# Patient Record
Sex: Female | Born: 1995 | Race: White | Hispanic: No | Marital: Single | Smoking: Never smoker
Health system: Southern US, Community
[De-identification: ages and names within clinical notes are randomized; demographics above are authoritative.]

## PROBLEM LIST (undated history)

## (undated) ENCOUNTER — Inpatient Hospital Stay (HOSPITAL_COMMUNITY): Payer: Self-pay

## (undated) DIAGNOSIS — F419 Anxiety disorder, unspecified: Secondary | ICD-10-CM

## (undated) DIAGNOSIS — K589 Irritable bowel syndrome without diarrhea: Secondary | ICD-10-CM

## (undated) DIAGNOSIS — J45909 Unspecified asthma, uncomplicated: Secondary | ICD-10-CM

## (undated) HISTORY — PX: WISDOM TOOTH EXTRACTION: SHX21

---
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
## 2016-01-31 ENCOUNTER — Ambulatory Visit
Admission: RE | Admit: 2016-01-31 | Discharge: 2016-01-31 | Disposition: A | Discharge status: Home / Self Care | Payer: 59 | Source: Ambulatory Visit | Attending: Family Medicine | Admitting: Family Medicine

## 2016-01-31 ENCOUNTER — Other Ambulatory Visit: Payer: Self-pay | Admitting: Family Medicine

## 2016-01-31 DIAGNOSIS — E041 Nontoxic single thyroid nodule: Secondary | ICD-10-CM

## 2016-02-01 ENCOUNTER — Other Ambulatory Visit: Payer: Self-pay | Admitting: Family Medicine

## 2016-02-01 DIAGNOSIS — E041 Nontoxic single thyroid nodule: Secondary | ICD-10-CM

## 2016-02-06 ENCOUNTER — Ambulatory Visit
Admission: RE | Admit: 2016-02-06 | Discharge: 2016-02-06 | Disposition: A | Discharge status: Home / Self Care | Payer: 59 | Source: Ambulatory Visit | Attending: Family Medicine | Admitting: Family Medicine

## 2016-02-06 ENCOUNTER — Other Ambulatory Visit (HOSPITAL_COMMUNITY)
Admission: RE | Admit: 2016-02-06 | Discharge: 2016-02-06 | Disposition: A | Discharge status: Home / Self Care | Payer: 59 | Source: Ambulatory Visit | Attending: General Surgery | Admitting: General Surgery

## 2016-02-06 DIAGNOSIS — E041 Nontoxic single thyroid nodule: Secondary | ICD-10-CM | POA: Insufficient documentation

## 2016-02-14 ENCOUNTER — Other Ambulatory Visit: Payer: Self-pay | Admitting: Otolaryngology

## 2016-02-19 NOTE — Pre-Procedure Instructions (Signed)
Kristin Graves  02/19/2016      [id number] - [place], [place] - [address], [address] [facility] [address], [address] [place] [id number] Phone: [phone] Fax: [phone]    Your procedure is scheduled on  Thursday, August 10th .  [facility] Admitting at 9:10 am             (posted surgery time is 11:10 am - 1:38 pm)   Call this number if you have problems the morning of surgery:  [phone]   Remember:  Do not eat food or drink liquids after midnight Wednesday.   Take these medicines the morning of surgery with A SIP OF WATER : Lexapro, Ativan   Do not wear jewelry, make-up or nail polish.  Do not wear lotions, powders, or perfumes.     Do not shave underarms & legs 48 hours prior to surgery.    Do not bring valuables to the hospital.  [facility] is not responsible for any belongings or valuables.  Contacts, dentures or bridgework may not be worn into surgery.  Leave your suitcase in the car.  After surgery it may be brought to your room. For patients admitted to the hospital, discharge time will be determined by your treatment team.    Name and phone number of your driver:     Please read over the following fact sheets that you were given. Pain Booklet and Surgical Site Infection Prevention

## 2016-02-20 ENCOUNTER — Ambulatory Visit (HOSPITAL_COMMUNITY)
Admission: RE | Admit: 2016-02-20 | Discharge: 2016-02-20 | Disposition: A | Discharge status: Home / Self Care | Payer: 59 | Source: Ambulatory Visit | Attending: Anesthesiology | Admitting: Anesthesiology

## 2016-02-20 ENCOUNTER — Encounter (HOSPITAL_COMMUNITY)
Admission: RE | Admit: 2016-02-20 | Discharge: 2016-02-20 | Disposition: A | Discharge status: Home / Self Care | Payer: 59 | Source: Ambulatory Visit | Attending: Otolaryngology | Admitting: Otolaryngology

## 2016-02-20 ENCOUNTER — Encounter (HOSPITAL_COMMUNITY): Payer: Self-pay

## 2016-02-20 DIAGNOSIS — Z01818 Encounter for other preprocedural examination: Secondary | ICD-10-CM | POA: Insufficient documentation

## 2016-02-20 HISTORY — DX: Unspecified asthma, uncomplicated: J45.909

## 2016-02-20 HISTORY — DX: Irritable bowel syndrome, unspecified: K58.9

## 2016-02-20 HISTORY — DX: Anxiety disorder, unspecified: F41.9

## 2016-02-20 LAB — CBC
HCT: 37.6 % (ref 36.0–46.0)
Hemoglobin: 12.3 g/dL (ref 12.0–15.0)
MCH: 29.6 pg (ref 26.0–34.0)
MCHC: 32.7 g/dL (ref 30.0–36.0)
MCV: 90.6 fL (ref 78.0–100.0)
PLATELETS: 293 10*3/uL (ref 150–400)
RBC: 4.15 MIL/uL (ref 3.87–5.11)
RDW: 12.5 % (ref 11.5–15.5)
WBC: 9.2 10*3/uL (ref 4.0–10.5)

## 2016-02-20 LAB — BASIC METABOLIC PANEL
ANION GAP: 7 (ref 5–15)
BUN: <5 mg/dL — ABNORMAL LOW (ref 6–20)
CO2: 25 mmol/L (ref 22–32)
Calcium: 9.6 mg/dL (ref 8.9–10.3)
Chloride: 105 mmol/L (ref 101–111)
Creatinine, Ser: 0.68 mg/dL (ref 0.44–1.00)
GFR calc Af Amer: >60 mL/min (ref >60)
Glucose, Bld: 75 mg/dL (ref 65–99)
POTASSIUM: 3.8 mmol/L (ref 3.5–5.1)
SODIUM: 137 mmol/L (ref 135–145)

## 2016-02-20 LAB — HCG, SERUM, QUALITATIVE: PREG SERUM: NEGATIVE

## 2016-02-20 NOTE — Progress Notes (Signed)
Pt. Followed by NP- Arlyce DiceKaplan at [facility]. Pt. Also followed by Dr. Loreta AveMann for IBS, has not started  Some new supplements that she was given at last appt. But will after this surgery.  Pt. Reports because of a vacation she was going on she rearrqnged her BCP so she intently did not have a period in July 2017. Pt. Denies cardiac concerns.

## 2016-02-22 ENCOUNTER — Encounter (HOSPITAL_COMMUNITY)
Admission: RE | Disposition: A | Discharge status: Home / Self Care | Payer: Self-pay | Source: Ambulatory Visit | Attending: Otolaryngology

## 2016-02-22 ENCOUNTER — Encounter (HOSPITAL_COMMUNITY): Payer: Self-pay | Admitting: Certified Registered Nurse Anesthetist

## 2016-02-22 ENCOUNTER — Ambulatory Visit (HOSPITAL_COMMUNITY): Payer: 59 | Admitting: Certified Registered"

## 2016-02-22 ENCOUNTER — Observation Stay (HOSPITAL_COMMUNITY)
Admission: RE | Admit: 2016-02-22 | Discharge: 2016-02-23 | Disposition: A | Discharge status: Home / Self Care | Payer: 59 | Source: Ambulatory Visit | Attending: Otolaryngology | Admitting: Otolaryngology

## 2016-02-22 DIAGNOSIS — E042 Nontoxic multinodular goiter: Principal | ICD-10-CM | POA: Insufficient documentation

## 2016-02-22 DIAGNOSIS — E079 Disorder of thyroid, unspecified: Secondary | ICD-10-CM | POA: Diagnosis present

## 2016-02-22 DIAGNOSIS — E0789 Other specified disorders of thyroid: Secondary | ICD-10-CM | POA: Diagnosis present

## 2016-02-22 HISTORY — PX: THYROIDECTOMY: SHX17

## 2016-02-22 SURGERY — THYROIDECTOMY
Anesthesia: General | Site: Neck | Laterality: Right

## 2016-02-22 MED ORDER — MIDAZOLAM HCL 2 MG/2ML IJ SOLN
INTRAMUSCULAR | Status: AC
  Filled 2016-02-22: qty 2

## 2016-02-22 MED ORDER — PROPOFOL 10 MG/ML IV BOLUS
INTRAVENOUS | Status: DC | PRN
  Administered 2016-02-22: 140 mg via INTRAVENOUS

## 2016-02-22 MED ORDER — 0.9 % SODIUM CHLORIDE (POUR BTL) OPTIME
TOPICAL | Status: DC | PRN
  Administered 2016-02-22: 1000 mL

## 2016-02-22 MED ORDER — LACTATED RINGERS IV SOLN
INTRAVENOUS | Status: DC
  Administered 2016-02-22 (×2): via INTRAVENOUS

## 2016-02-22 MED ORDER — LIDOCAINE 2% (20 MG/ML) 5 ML SYRINGE
INTRAMUSCULAR | Status: DC | PRN
  Administered 2016-02-22: 60 mg via INTRAVENOUS

## 2016-02-22 MED ORDER — HYDROCODONE-ACETAMINOPHEN 5-325 MG PO TABS
1.0000 | ORAL_TABLET | ORAL | Status: DC | PRN
  Administered 2016-02-22 – 2016-02-23 (×2): 1 via ORAL
  Filled 2016-02-22 (×2): qty 1

## 2016-02-22 MED ORDER — SUCCINYLCHOLINE CHLORIDE 200 MG/10ML IV SOSY
PREFILLED_SYRINGE | INTRAVENOUS | Status: DC | PRN
  Administered 2016-02-22: 100 mg via INTRAVENOUS

## 2016-02-22 MED ORDER — PROPOFOL 10 MG/ML IV BOLUS
INTRAVENOUS | Status: AC
  Filled 2016-02-22: qty 20

## 2016-02-22 MED ORDER — HYDROMORPHONE HCL 1 MG/ML IJ SOLN
0.2500 mg | INTRAMUSCULAR | Status: DC | PRN
  Administered 2016-02-22 (×4): 0.25 mg via INTRAVENOUS

## 2016-02-22 MED ORDER — ONDANSETRON 4 MG PO TBDP: 4.0000 mg | ORAL_TABLET | Freq: Four times a day (QID) | ORAL | Status: DC | PRN

## 2016-02-22 MED ORDER — HYDROMORPHONE HCL 1 MG/ML IJ SOLN
INTRAMUSCULAR | Status: AC
  Administered 2016-02-22: 0.25 mg via INTRAVENOUS
  Filled 2016-02-22: qty 1

## 2016-02-22 MED ORDER — FENTANYL CITRATE (PF) 100 MCG/2ML IJ SOLN
INTRAMUSCULAR | Status: DC | PRN
  Administered 2016-02-22: 50 ug via INTRAVENOUS
  Administered 2016-02-22: 150 ug via INTRAVENOUS

## 2016-02-22 MED ORDER — FENTANYL CITRATE (PF) 250 MCG/5ML IJ SOLN
INTRAMUSCULAR | Status: AC
  Filled 2016-02-22: qty 5

## 2016-02-22 MED ORDER — MIDAZOLAM HCL 5 MG/5ML IJ SOLN
INTRAMUSCULAR | Status: DC | PRN
  Administered 2016-02-22: 2 mg via INTRAVENOUS

## 2016-02-22 MED ORDER — ONDANSETRON HCL 4 MG/2ML IJ SOLN
INTRAMUSCULAR | Status: DC | PRN
  Administered 2016-02-22: 4 mg via INTRAVENOUS

## 2016-02-22 MED ORDER — DEXTROSE-NACL 5-0.45 % IV SOLN
INTRAVENOUS | Status: DC
  Administered 2016-02-22 – 2016-02-23 (×2): via INTRAVENOUS

## 2016-02-22 MED ORDER — ONDANSETRON HCL 4 MG/2ML IJ SOLN: 4.0000 mg | Freq: Four times a day (QID) | INTRAMUSCULAR | Status: DC | PRN

## 2016-02-22 SURGICAL SUPPLY — 59 items
APPLIER CLIP 9.375 SM OPEN (CLIP) ×3
APR CLP SM 9.3 20 MLT OPN (CLIP) ×1
ATTRACTOMAT 16X20 MAGNETIC DRP (DRAPES) IMPLANT
BLADE SURG 15 STRL LF DISP TIS (BLADE) IMPLANT
BLADE SURG 15 STRL SS (BLADE)
BLADE SURG ROTATE 9660 (MISCELLANEOUS) IMPLANT
CANISTER SUCTION 2500CC (MISCELLANEOUS) ×3 IMPLANT
CLEANER TIP ELECTROSURG 2X2 (MISCELLANEOUS) ×3 IMPLANT
CLIP APPLIE 9.375 SM OPEN (CLIP) ×1 IMPLANT
CONT SPEC 4OZ CLIKSEAL STRL BL (MISCELLANEOUS) ×6 IMPLANT
CORDS BIPOLAR (ELECTRODE) ×3 IMPLANT
COVER SURGICAL LIGHT HANDLE (MISCELLANEOUS) ×3 IMPLANT
CRADLE DONUT ADULT HEAD (MISCELLANEOUS) IMPLANT
DRAIN JACKSON RD 7FR 3/32 (WOUND CARE) ×5 IMPLANT
DRAPE POUCH INSTRU U-SHP 10X18 (DRAPES) ×2 IMPLANT
DRAPE PROXIMA HALF (DRAPES) ×3 IMPLANT
ELECT COATED BLADE 2.86 ST (ELECTRODE) ×3 IMPLANT
ELECT PAIRED SUBDERMAL (MISCELLANEOUS) ×3
ELECT REM PT RETURN 9FT ADLT (ELECTROSURGICAL) ×3
ELECTRODE PAIRED SUBDERMAL (MISCELLANEOUS) IMPLANT
ELECTRODE REM PT RTRN 9FT ADLT (ELECTROSURGICAL) ×1 IMPLANT
EVACUATOR SILICONE 100CC (DRAIN) ×3 IMPLANT
GAUZE SPONGE 2X2 8PLY STRL LF (GAUZE/BANDAGES/DRESSINGS) IMPLANT
GAUZE SPONGE 4X4 16PLY XRAY LF (GAUZE/BANDAGES/DRESSINGS) ×5 IMPLANT
GLOVE BIO SURGEON STRL SZ 6.5 (GLOVE) ×2 IMPLANT
GLOVE BIO SURGEONS STRL SZ 6.5 (GLOVE) ×1
GLOVE BIOGEL PI IND STRL 6.5 (GLOVE) IMPLANT
GLOVE BIOGEL PI INDICATOR 6.5 (GLOVE) ×2
GLOVE ECLIPSE 6.5 STRL STRAW (GLOVE) ×4 IMPLANT
GLOVE ECLIPSE 7.5 STRL STRAW (GLOVE) ×3 IMPLANT
GLOVE ECLIPSE 8.0 STRL XLNG CF (GLOVE) ×3 IMPLANT
GOWN BRE IMP SLV AUR XL STRL (GOWN DISPOSABLE) ×3 IMPLANT
GOWN STRL REUS W/ TWL LRG LVL3 (GOWN DISPOSABLE) ×2 IMPLANT
GOWN STRL REUS W/TWL LRG LVL3 (GOWN DISPOSABLE) ×9
KIT BASIN OR (CUSTOM PROCEDURE TRAY) ×3 IMPLANT
KIT ROOM TURNOVER OR (KITS) ×3 IMPLANT
LIQUID BAND (GAUZE/BANDAGES/DRESSINGS) ×3 IMPLANT
LOCATOR NERVE 3 VOLT (DISPOSABLE) IMPLANT
NDL HYPO 25GX1X1/2 BEV (NEEDLE) IMPLANT
NEEDLE HYPO 25GX1X1/2 BEV (NEEDLE) IMPLANT
NS IRRIG 1000ML POUR BTL (IV SOLUTION) ×3 IMPLANT
PAD ARMBOARD 7.5X6 YLW CONV (MISCELLANEOUS) ×6 IMPLANT
PENCIL FOOT CONTROL (ELECTRODE) ×3 IMPLANT
PROBE NERVBE PRASS .33 (MISCELLANEOUS) ×3 IMPLANT
SHEARS HARMONIC 9CM CVD (BLADE) ×3 IMPLANT
SPONGE GAUZE 2X2 STER 10/PKG (GAUZE/BANDAGES/DRESSINGS) ×2
SPONGE INTESTINAL PEANUT (DISPOSABLE) IMPLANT
STAPLER VISISTAT 35W (STAPLE) ×5 IMPLANT
SUT CHROMIC 4 0 PS 2 18 (SUTURE) ×6 IMPLANT
SUT ETHILON 3 0 PS 1 (SUTURE) ×3 IMPLANT
SUT SILK 2 0 (SUTURE) ×3
SUT SILK 2-0 18XBRD TIE 12 (SUTURE) ×1 IMPLANT
SUT SILK 4 0 (SUTURE)
SUT SILK 4-0 18XBRD TIE 12 (SUTURE) IMPLANT
TAPE CLOTH SURG 4X10 WHT LF (GAUZE/BANDAGES/DRESSINGS) ×2 IMPLANT
TOWEL OR 17X24 6PK STRL BLUE (TOWEL DISPOSABLE) ×3 IMPLANT
TRAY ENT MC OR (CUSTOM PROCEDURE TRAY) ×3 IMPLANT
TRAY FOLEY CATH 16FR SILVER (SET/KITS/TRAYS/PACK) IMPLANT
TUBE ENDOTRAC EMG 7X10.2 (MISCELLANEOUS) ×2 IMPLANT

## 2016-02-22 NOTE — H&P (Signed)
Kristin Graves is an 20 y.o. female.   Chief Complaint: right thyroid massHPI: hx of mass in the right thyroid. She had attempt at FNA but nondiagnositic. She does not want to repeat and wants it out.   Past Medical History:  Diagnosis Date  . Anxiety   . Asthma    yrs. since she had to use rescue inhaler , it was exercise induced   . IBS (irritable bowel syndrome)     Past Surgical History:  Procedure Laterality Date  . 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 WISDOM TOOTH EXTRACTION      No family history on file. Social History:  reports that she has never smoked. She has never used smokeless tobacco. She reports that she does not drink alcohol or use drugs.  Allergies:  Allergies  Allergen Reactions  . No Known Allergies Other (See Comments)    No prescriptions prior to admission.    Results for orders placed or performed during the hospital encounter of 02/20/16 (from the past 48 hour(s))  hCG, serum, qualitative     Status: None   Collection Time: 02/20/16  2:12 PM  Result Value Ref Range   Preg, Serum NEGATIVE NEGATIVE    Comment:        THE SENSITIVITY OF THIS METHODOLOGY IS >10 mIU/mL.   CBC     Status: None   Collection Time: 02/20/16  2:12 PM  Result Value Ref Range   WBC 9.2 4.0 - 10.5 K/uL   RBC 4.15 3.87 - 5.11 MIL/uL   Hemoglobin 12.3 12.0 - 15.0 g/dL   HCT 37.6 36.0 - 46.0 %   MCV 90.6 78.0 - 100.0 fL   MCH 29.6 26.0 - 34.0 pg   MCHC 32.7 30.0 - 36.0 g/dL   RDW 12.5 11.5 - 15.5 %   Platelets 293 150 - 400 K/uL  Basic metabolic panel     Status: Abnormal   Collection Time: 02/20/16  2:12 PM  Result Value Ref Range   Sodium 137 135 - 145 mmol/L   Potassium 3.8 3.5 - 5.1 mmol/L   Chloride 105 101 - 111 mmol/L   CO2 25 22 - 32 mmol/L   Glucose, Bld 75 65 - 99 mg/dL   BUN <5 (L) 6 - 20 mg/dL   Creatinine, Ser 0.68 0.44 - 1.00 mg/dL   Calcium 9.6 8.9 - 10.3 mg/dL   GFR calc non Af Amer >60 >60 mL/min   GFR calc Af Amer >60 >60  mL/min    Comment: (NOTE) The eGFR has been calculated using the CKD EPI equation. This calculation has not been validated in all clinical situations. eGFR's persistently <60 mL/min signify possible Chronic Kidney Disease.    Anion gap 7 5 - 15   Dg Chest 2 View  Result Date: 02/20/2016 CLINICAL DATA:  Preoperative respiratory evaluation for thyroidectomy. EXAM: CHEST  2 VIEW COMPARISON:  None. FINDINGS: The lungs are clear wiithout focal pneumonia, edema, pneumothorax or pleural effusion. The cardiopericardial silhouette is within normal limits for size. Radiopaque foreign body projecting over the posterior lateral seventh ribs bilaterally presumed to be external to the patient. The visualized bony structures of the thorax are intact. IMPRESSION: No active cardiopulmonary disease. Electronically Signed   By: Misty Stanley M.D.   On: 02/20/2016 15:37    Review of Systems  Constitutional: Negative.   HENT: Negative.   Eyes: Negative.   Respiratory: Negative.   Cardiovascular: Negative.  Skin: Negative.     Last menstrual period 12/23/2015. Physical Exam  Constitutional: She appears well-developed and well-nourished.  HENT:  Head: Normocephalic.  Nose: Nose normal.  Mouth/Throat: Oropharynx is clear and moist.  Eyes: Conjunctivae are normal. Pupils are equal, round, and reactive to light.  Neck: Normal range of motion. Thyromegaly present.  Cardiovascular: Normal rate.   Respiratory: Effort normal.  GI: Soft.     Assessment/Plan Thyroid mass- discussed thyroidectomy and possible total. She is ready to proceed  Melissa Montane, MD 02/22/2016, 7:17 AM

## 2016-02-22 NOTE — Op Note (Signed)
Preop/postop diagnosis: Right thyroid mass Procedure: Right thyroidectomy with Nims monitor Anesthesia: Gen. Estimated blood loss: Proximally 20 mL Indications: 20 year old with a mass in the right neck that has come up for many months. She had a fine-needle aspiration which did not show a definitive diagnosis. She did not want to repeat that. She wants to have the mass removed. She was informed a risk and benefits of the procedure and options were discussed all questions are answered and consent was obtained. Procedure: Patient was taken to the operating room placed in the supine position after general Nims monitor anesthesia was placed in the supine position prepped and draped in the usual sterile manner. The Nims monitor was connected and there seem to be good function. An incision was outlined in the lower neck in a skin crease opened with a 15 blade. Dissection was carried down to the platysma muscle and it was divided laterally but a small incision was made to only the very medial aspect of the platysma was visualized. A subplatysmal flap was elevated superiorly and inferiorly. The fascia was then divided over the strap muscles in the diastases of the strap muscle was identified. Dissection was carried off to the right where the thyroid gland was identified. Layer by layer dissection over the capsule of the thyroid was performed. Both the upper and lower parathyroid glands were identified and dissected off the thyroid gland. The artery and veins were divided with the harmonic scalpel. The nerve was identified and then confirmed with the Nims monitor stimulator. Dissection was carried up to the nerve entrance and again the stimulator was performed to check its position which was identified. Dissection was then carried out medial to this on Berry's ligament and the thyroid gland was removed to the isthmus and the isthmus was divided. The gland was sent for frozen section which came back benign. The wound  was irrigated with saline and then a #7 JP drain was placed into the wound and brought out the wound laterally. The wound was closed with interrupted 4-0 chromic and Dermabond to close the skin. She was then awakened brought to recovery room in stable condition counts correct

## 2016-02-22 NOTE — Anesthesia Procedure Notes (Signed)
Procedure Name: Intubation Date/Time: 02/22/2016 11:43 AM Performed by: Bobbie StackANDERSON, Lakela Kuba KIRSTEN Pre-anesthesia Checklist: Patient identified, Emergency Drugs available, Suction available and Patient being monitored Patient Re-evaluated:Patient Re-evaluated prior to inductionOxygen Delivery Method: Circle system utilized Preoxygenation: Pre-oxygenation with 100% oxygen Intubation Type: IV induction Ventilation: Mask ventilation without difficulty Laryngoscope Size: Glidescope and 3 Grade View: Grade I Tube type: Oral (NIMS tube) Tube size: 7.0 mm Number of attempts: 1 Airway Equipment and Method: Video-laryngoscopy and Rigid stylet Placement Confirmation: ETT inserted through vocal cords under direct vision,  positive ETCO2 and breath sounds checked- equal and bilateral Tube secured with: Tape Dental Injury: Teeth and Oropharynx as per pre-operative assessment  Comments: Glidescope used to ensure proper positioning for NIMS tube.  Surgeon at bedside.  Agreed with placement and correct positioning of ETT.

## 2016-02-22 NOTE — Anesthesia Postprocedure Evaluation (Signed)
Anesthesia Post Note  Patient: Kristin Graves  Procedure(s) Performed: Procedure(s) (LRB): Right THYROIDECTOMY (Right)  Patient location during evaluation: PACU Anesthesia Type: General Level of consciousness: awake and alert Pain management: pain level controlled Vital Signs Assessment: post-procedure vital signs reviewed and stable Respiratory status: spontaneous breathing, nonlabored ventilation, respiratory function stable and patient connected to nasal cannula oxygen Cardiovascular status: blood pressure returned to baseline and stable Postop Assessment: no signs of nausea or vomiting Anesthetic complications: no    Last Vitals:  Vitals:   02/22/16 1356 02/22/16 1400  BP: 130/82   Pulse: 83 83  Resp: 14 20  Temp:      Last Pain:  Vitals:   02/22/16 1345  TempSrc:   PainSc: Asleep                 Navjot Loera,W. EDMOND

## 2016-02-22 NOTE — Transfer of Care (Signed)
Immediate Anesthesia Transfer of Care Note  Patient: Kristin Graves  Procedure(s) Performed: Procedure(s): Right THYROIDECTOMY (Right)  Patient Location: PACU  Anesthesia Type:General  Level of Consciousness: patient cooperative and responds to stimulation, drowsy  Airway & Oxygen Therapy: Patient Spontanous Breathing and Patient connected to nasal cannula oxygen  Post-op Assessment: Report given to RN and Post -op Vital signs reviewed and stable  Post vital signs: Reviewed and stable  Last Vitals:  Vitals:   02/22/16 0950 02/22/16 1312  BP: 117/65 121/60  Pulse: 78 (!) 113  Resp: 18 12  Temp: 37.2 C     Last Pain:  Vitals:   02/22/16 0950  TempSrc: Oral      Patients Stated Pain Goal: 2 (02/22/16 0931)  Complications: No apparent anesthesia complications

## 2016-02-22 NOTE — Anesthesia Preprocedure Evaluation (Addendum)
Anesthesia Evaluation  Patient identified by MRN, date of birth, ID band Patient awake    Reviewed: Allergy & Precautions, H&P , NPO status , Patient's Chart, lab work & pertinent test results  Airway Mallampati: II  TM Distance: >3 FB Neck ROM: Full    Dental no notable dental hx. (+) Teeth Intact, Dental Advisory Given   Pulmonary asthma ,    Pulmonary exam normal breath sounds clear to auscultation       Cardiovascular negative cardio ROS   Rhythm:Regular Rate:Normal     Neuro/Psych Anxiety negative neurological ROS     GI/Hepatic negative GI ROS, Neg liver ROS,   Endo/Other  negative endocrine ROS  Renal/GU negative Renal ROS  negative genitourinary   Musculoskeletal   Abdominal   Peds  Hematology negative hematology ROS (+)   Anesthesia Other Findings   Reproductive/Obstetrics negative OB ROS                            Anesthesia Physical Anesthesia Plan  ASA: II  Anesthesia Plan: General   Post-op Pain Management:    Induction: Intravenous  Airway Management Planned: Oral ETT  Additional Equipment:   Intra-op Plan:   Post-operative Plan: Extubation in OR  Informed Consent: I have reviewed the patients History and Physical, chart, labs and discussed the procedure including the risks, benefits and alternatives for the proposed anesthesia with the patient or authorized representative who has indicated his/her understanding and acceptance.   Dental advisory given  Plan Discussed with: CRNA  Anesthesia Plan Comments:         Anesthesia Quick Evaluation

## 2016-02-22 NOTE — Progress Notes (Signed)
Patient ID: Kristin Graves, female   DOB: Nov 27, 1995, 20 y.o.   MRN: [id number] Post op check  No complaints, drinking well so far. Voice normal. Incision clean, dry, intact. JP charged and functioning.  Stable post op. Overnight observation.

## 2016-02-23 ENCOUNTER — Encounter (HOSPITAL_COMMUNITY): Payer: Self-pay | Admitting: Otolaryngology

## 2016-02-23 DIAGNOSIS — E042 Nontoxic multinodular goiter: Secondary | ICD-10-CM | POA: Diagnosis not present

## 2016-02-23 MED ORDER — HYDROCODONE-ACETAMINOPHEN 5-325 MG PO TABS: 1.0000 | ORAL_TABLET | ORAL | 0 refills | Status: DC | PRN

## 2016-02-23 NOTE — Discharge Summary (Signed)
Physician Discharge Summary  Patient ID: Kristin Graves MRN: [id number] DOB/AGE: [birth year] 20 y.o.  Admit date: 02/22/2016 Discharge date: 02/23/2016  Admission Diagnoses:thyroid mass  Discharge Diagnoses: same Active Problems:   Thyroid mass   Discharged Condition: good  Hospital Course: admitted after thyroid and did well. Ambulating and taking fluids. Drain removed and wound excellent. No cramping. Voice normal. Discharge rto home and follow up in 1 week.   Consults: None  Significant Diagnostic Studies: 0  Treatments: thyroidectomy right  Discharge Exam: Blood pressure (!) 98/50, pulse 62, temperature 98.3 F (36.8 C), temperature source Oral, resp. rate 15, height 5\' 11"  (1.803 m), weight 65.7 kg (144 lb 13.5 oz), last menstrual period 12/23/2015, SpO2 98 %. alert awake. voice normal. wound excellent. no swelling or eryhtema. cv rrr l- clear ext no swelling or pain.  Disposition: Final discharge disposition not confirmed      Signed: Suzanna ObeyBYERS, Dareld Mcauliffe 02/23/2016, 7:17 AM

## 2016-02-23 NOTE — Progress Notes (Signed)
Dc instructions given to pt at this time.  Pt verbalized understanding of all instructions.  No s/s of any acute distress noted.  No c/o pain.

## 2017-01-23 ENCOUNTER — Other Ambulatory Visit: Payer: Self-pay | Admitting: Gastroenterology

## 2017-01-23 DIAGNOSIS — R1033 Periumbilical pain: Secondary | ICD-10-CM

## 2017-01-23 NOTE — Progress Notes (Signed)
Miasia Crabtree MD 

## 2017-01-28 ENCOUNTER — Ambulatory Visit
Admission: RE | Admit: 2017-01-28 | Discharge: 2017-01-28 | Disposition: A | Discharge status: Home / Self Care | Payer: 59 | Source: Ambulatory Visit | Attending: Gastroenterology | Admitting: Gastroenterology

## 2017-01-28 DIAGNOSIS — R1033 Periumbilical pain: Secondary | ICD-10-CM

## 2017-01-28 MED ORDER — IOPAMIDOL (ISOVUE-300) INJECTION 61%
100.0000 mL | Freq: Once | INTRAVENOUS | Status: AC | PRN
  Administered 2017-01-28: 100 mL via INTRAVENOUS

## 2018-02-23 LAB — OB RESULTS CONSOLE GC/CHLAMYDIA
CHLAMYDIA, DNA PROBE: NEGATIVE
Gonorrhea: NEGATIVE

## 2018-02-23 LAB — OB RESULTS CONSOLE RPR: RPR: NONREACTIVE

## 2018-02-23 LAB — OB RESULTS CONSOLE HIV ANTIBODY (ROUTINE TESTING): HIV: NONREACTIVE

## 2018-02-25 IMAGING — CT CT ABD-PELV W/ CM
2 of 4 series · 15 of 46 positions shown, 17 images · IV contrast (APPLIED)
Comparison: None.

CLINICAL DATA: 21-year-old female with chronic
constipation/diarrhea and generalized abdominal pain.

EXAM:
CT ABDOMEN AND PELVIS WITH CONTRAST
TECHNIQUE: Multidetector CT imaging of the abdomen and pelvis was performed
using the standard protocol following bolus administration of
intravenous contrast.
CONTRAST:  100mL [id number] IOPAMIDOL ([id number]) INJECTION 61%

[Series 2: abd/pelvis w/cm · axial · 0.63mm/px · z∈[-156,+274]mm · 12 of 98 slices shown, 14 images]
[im 8/98  soft-tissue]
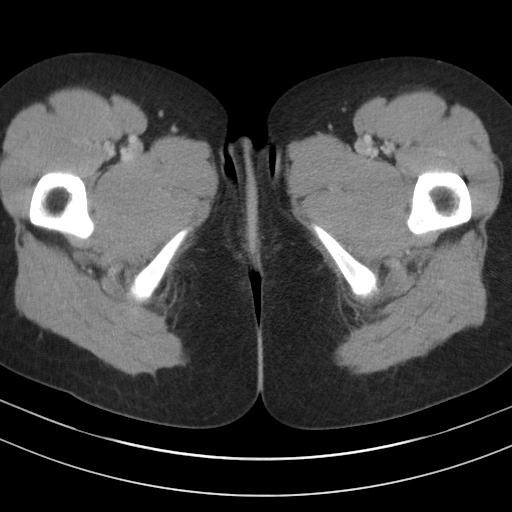
[im 8/98  bone]
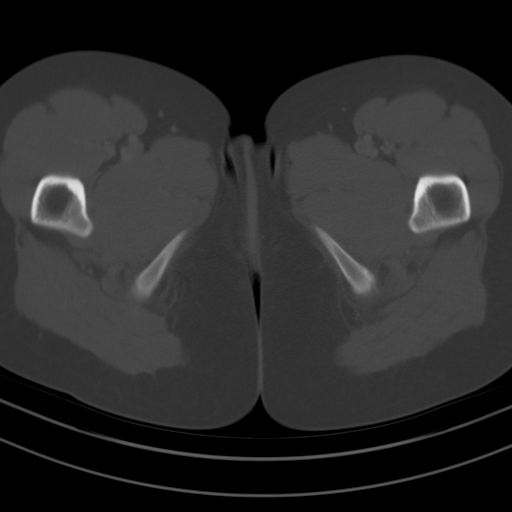
[im 16/98  soft-tissue]
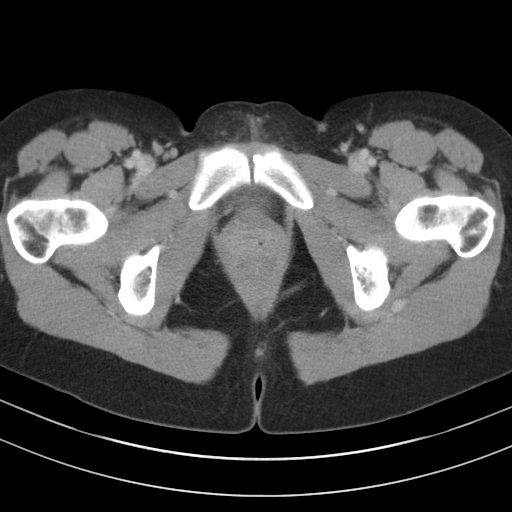
[im 24/98  soft-tissue]
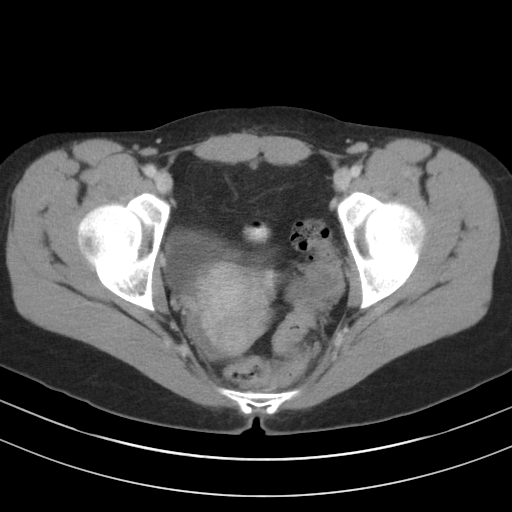
[im 32/98  soft-tissue]
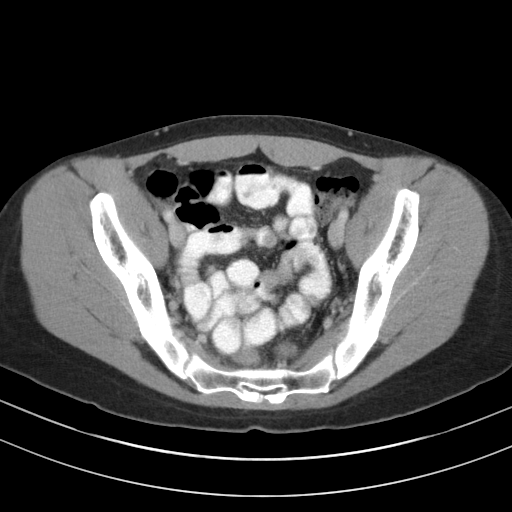
[im 39/98  soft-tissue]
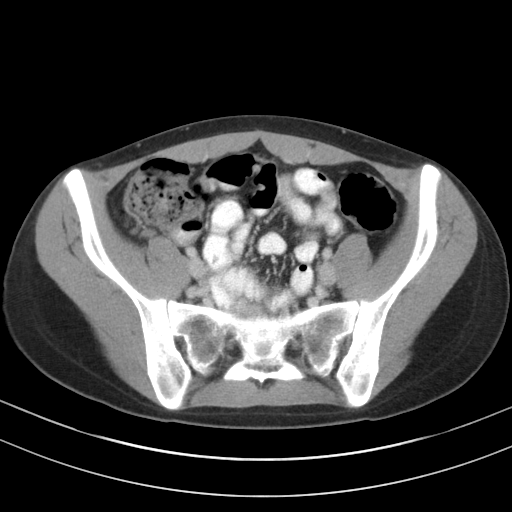
[im 47/98  soft-tissue]
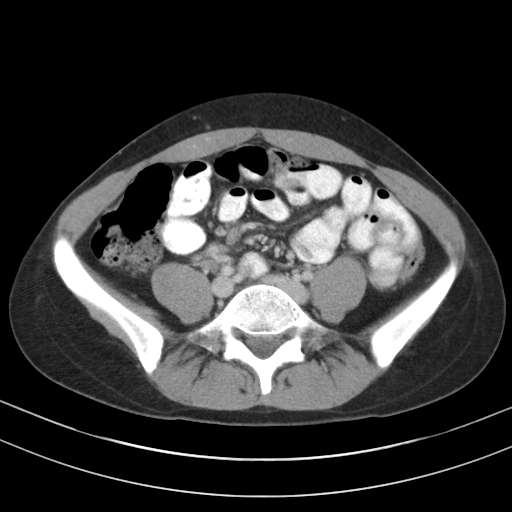
[im 55/98  soft-tissue]
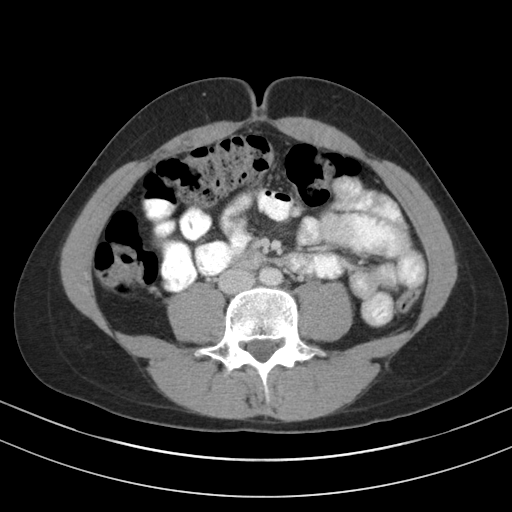
[im 63/98  soft-tissue]
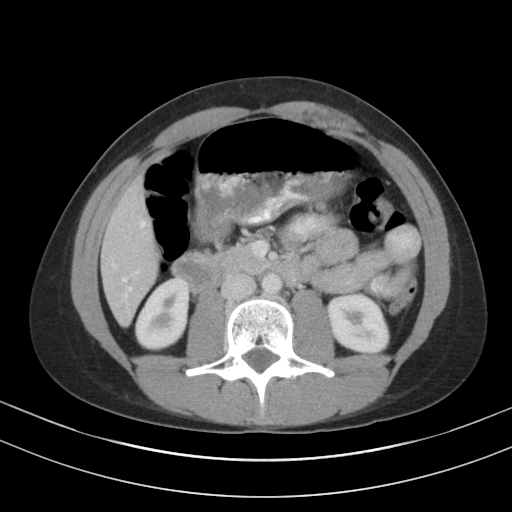
[im 70/98  soft-tissue]
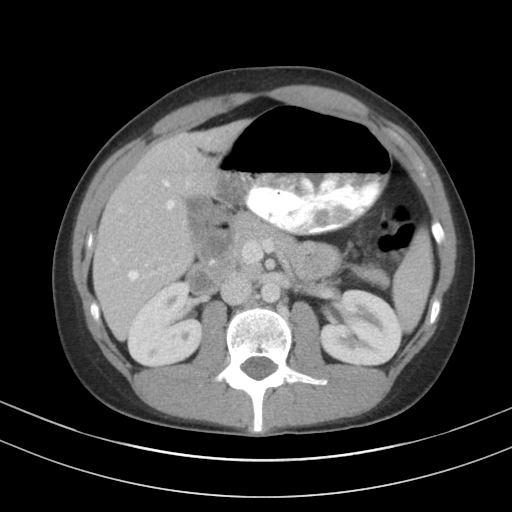
[im 70/98  bone]
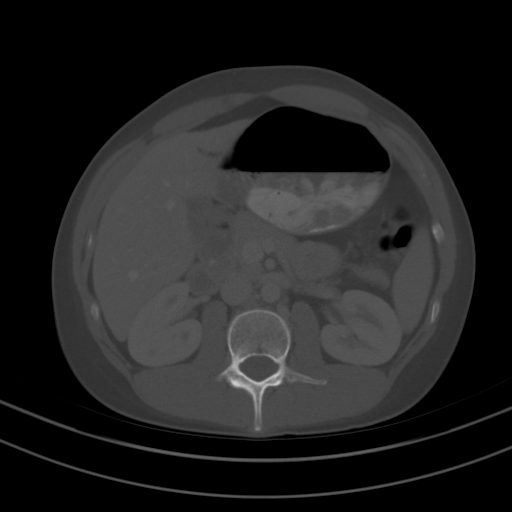
[im 78/98  soft-tissue]
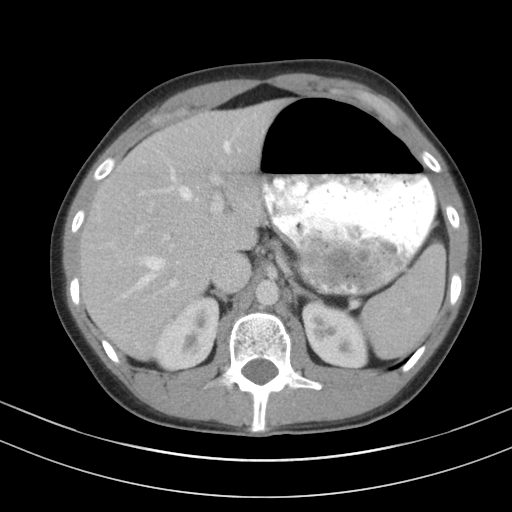
[im 86/98  soft-tissue]
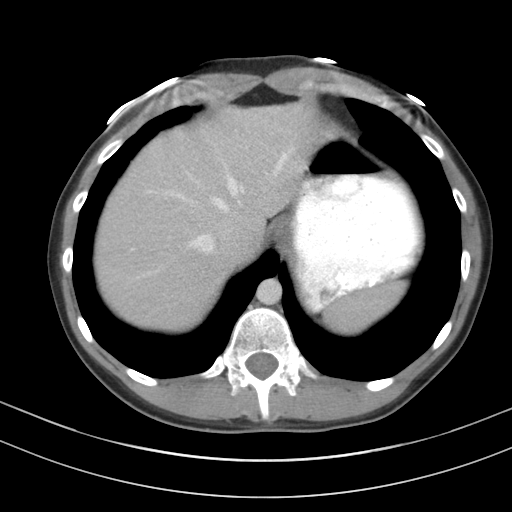
[im 94/98  soft-tissue]
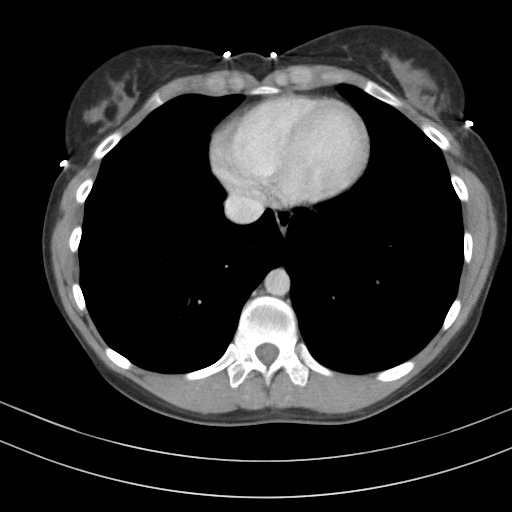

[Series 3: cor · coronal · 0.64mm/px · 3 of 86 slices shown]
[im 29/86  soft-tissue]
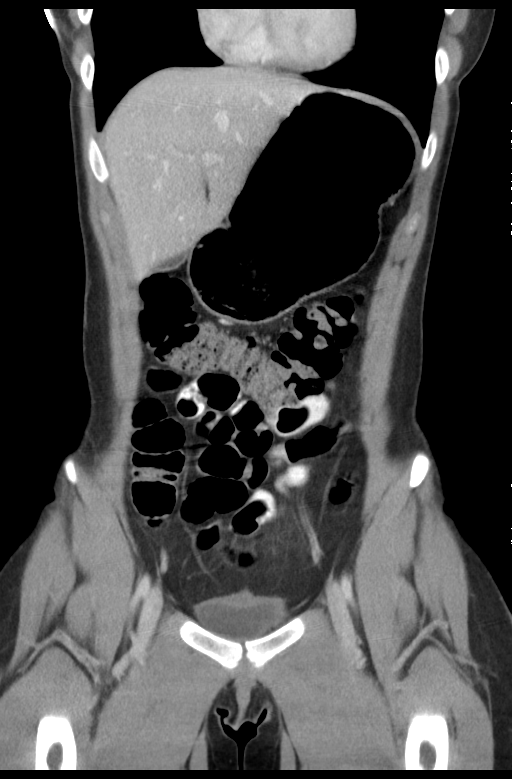
[im 38/86  soft-tissue]
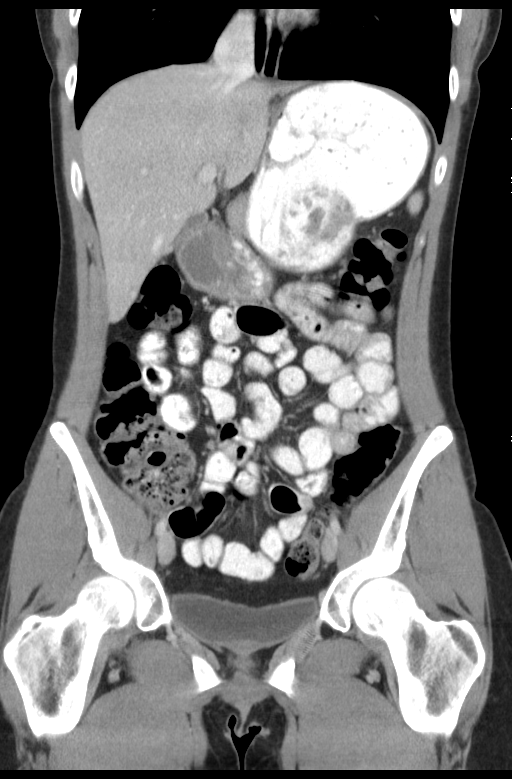
[im 48/86  soft-tissue]
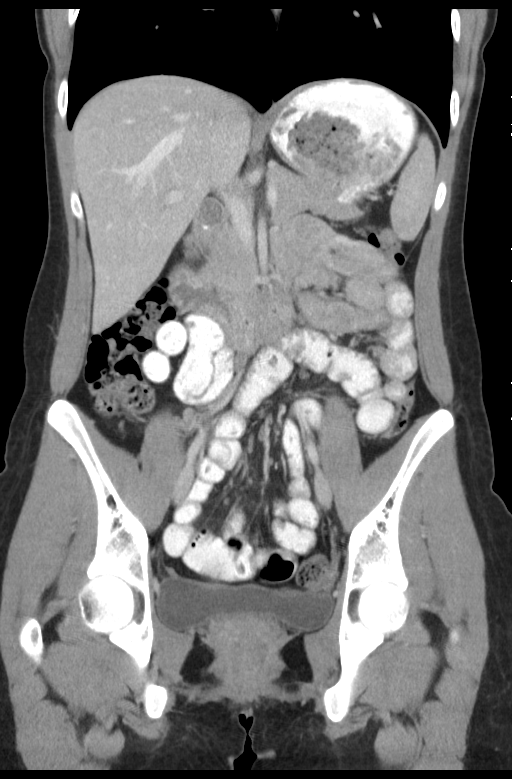

[15 of 46 positions shown; findings below may reference images not displayed]

FINDINGS: Lower chest: Partially visualized 2.5 cm left lower lobe subpleural
bleb. The visualized lung bases are clear.

No intra-abdominal free air or free fluid.

Hepatobiliary: No focal liver abnormality is seen. No gallstones,
gallbladder wall thickening, or biliary dilatation.

Pancreas: Unremarkable. No pancreatic ductal dilatation or
surrounding inflammatory changes.

Spleen: Normal in size without focal abnormality.

Adrenals/Urinary Tract: Adrenal glands are unremarkable. Kidneys are
normal, without renal calculi, focal lesion, or hydronephrosis.
Bladder is unremarkable.

Stomach/Bowel: The stomach is distended with oral contrast and
gastric content. There is no evidence of gastric outlet obstruction.
There is moderate colonic stool burden. There is no evidence of
bowel obstruction or active inflammation. Normal appendix.

Vascular/Lymphatic: The abdominal aorta and IVC appear unremarkable.
The origins of the celiac axis, SMA, IMA appear patent. No portal
venous gas identified. There is no adenopathy.

Reproductive: The uterus is anteflexed appears unremarkable. The
ovaries appear unremarkable. The right ovary is located in the
posterior pelvis. No pelvic mass.

Other: Small fat containing umbilical hernia.

Musculoskeletal: There is mild reversal of normal lumbar lordosis.
The osseous structures are intact.
IMPRESSION: No acute intra-abdominal or pelvic pathology. No bowel obstruction
or active inflammation. Normal appendix.

## 2018-07-15 NOTE — L&D Delivery Note (Signed)
Delivery Note At 1:27 PM a viable female was delivered via Vaginal, Spontaneous (Presentation: OA to ROA).  APGAR: 9, 9; weight 8lb 2.3oz .   Placenta status: complete.  Cord: 3V  with the following complications: none .  Cord pH: n/a  Anesthesia: epidural  Episiotomy: None Lacerations: Periurethral Suture Repair: 3.0 vicryl Est. Blood Loss (mL):  167  Mom to postpartum.  Baby to Couplet care / Skin to Skin.  Jonetta Speak 09/21/2018, 2:41 PM

## 2018-08-13 ENCOUNTER — Encounter (HOSPITAL_COMMUNITY): Payer: Self-pay

## 2018-08-13 ENCOUNTER — Inpatient Hospital Stay (HOSPITAL_COMMUNITY)
Admission: AD | Admit: 2018-08-13 | Discharge: 2018-08-13 | Disposition: A | Discharge status: Home / Self Care | Payer: 59 | Attending: Obstetrics and Gynecology | Admitting: Obstetrics and Gynecology

## 2018-08-13 ENCOUNTER — Other Ambulatory Visit: Payer: Self-pay

## 2018-08-13 DIAGNOSIS — R0981 Nasal congestion: Secondary | ICD-10-CM | POA: Diagnosis present

## 2018-08-13 DIAGNOSIS — O26893 Other specified pregnancy related conditions, third trimester: Secondary | ICD-10-CM | POA: Diagnosis not present

## 2018-08-13 DIAGNOSIS — R000 Tachycardia, unspecified: Secondary | ICD-10-CM | POA: Insufficient documentation

## 2018-08-13 DIAGNOSIS — J00 Acute nasopharyngitis [common cold]: Secondary | ICD-10-CM

## 2018-08-13 DIAGNOSIS — Z3A34 34 weeks gestation of pregnancy: Secondary | ICD-10-CM | POA: Insufficient documentation

## 2018-08-13 LAB — URINALYSIS, ROUTINE W REFLEX MICROSCOPIC
BILIRUBIN URINE: NEGATIVE
GLUCOSE, UA: NEGATIVE mg/dL
Ketones, ur: NEGATIVE mg/dL
Leukocytes, UA: NEGATIVE
Nitrite: NEGATIVE
PROTEIN: NEGATIVE mg/dL
Specific Gravity, Urine: <1.005 — ABNORMAL LOW (ref 1.005–1.030)
pH: 7 (ref 5.0–8.0)

## 2018-08-13 LAB — URINALYSIS, MICROSCOPIC (REFLEX): RBC / HPF: NONE SEEN RBC/hpf (ref 0–5)

## 2018-08-13 NOTE — MAU Note (Addendum)
Pt reports sinus pressure that started on Tuesday. She reports coughing that started last night.   Pt states that [facility] and states that the person taking her vitals said her heart rate was high. She was told to call her OBGYN and when she did they told her to come in.   Denies vaginal bleeding or LOF.   Reports +FM

## 2018-08-13 NOTE — Discharge Instructions (Signed)
Cough, Adult  A cough helps to clear your throat and lungs. A cough may last only 2-3 weeks (acute), or it may last longer than 8 weeks (chronic). Many different things can cause a cough. A cough may be a sign of an illness or another medical condition. Follow these instructions at home:  Pay attention to any changes in your cough.  Take medicines only as told by your doctor. ? If you were prescribed an antibiotic medicine, take it as told by your doctor. Do not stop taking it even if you start to feel better. ? Talk with your doctor before you try using a cough medicine.  Drink enough fluid to keep your pee (urine) clear or pale yellow.  If the air is dry, use a cold steam vaporizer or humidifier in your home.  Stay away from things that make you cough at work or at home.  If your cough is worse at night, try using extra pillows to raise your head up higher while you sleep.  Do not smoke, and try not to be around smoke. If you need help quitting, ask your doctor.  Do not have caffeine.  Do not drink alcohol.  Rest as needed. Contact a doctor if:  You have new problems (symptoms).  You cough up yellow fluid (pus).  Your cough does not get better after 2-3 weeks, or your cough gets worse.  Medicine does not help your cough and you are not sleeping well.  You have pain that gets worse or pain that is not helped with medicine.  You have a fever.  You are losing weight and you do not know why.  You have night sweats. Get help right away if:  You cough up blood.  You have trouble breathing.  Your heartbeat is very fast. This information is not intended to replace advice given to you by your health care provider. Make sure you discuss any questions you have with your health care provider. Document Released: 03/14/2011 Document Revised: 12/07/2015 Document Reviewed: 09/07/2014 Elsevier Interactive Patient Education  2019 [employer].   How to Perform a Sinus Rinse A  sinus rinse is a home treatment. It rinses your sinuses with a mixture of salt and water (saline solution). Sinuses are air-filled spaces in your skull behind the bones of your face and forehead. They open into your nasal cavity. A sinus rinse can help to clear your nasal cavity. It can clear mucus, dirt, dust, or pollen. You may do a sinus rinse when you have:  A cold.  A virus.  Allergies.  A sinus infection.  A stuffy nose. Talk with your doctor about whether a sinus rinse might help you. What are the risks? A sinus rinse is normally very safe and helpful. However, there are a few risks. These include:  A burning feeling in the sinuses. This may happen if you do not make the saline solution as instructed. Be sure to follow all directions when making the saline solution.  Nasal irritation.  Infection from unclean water. This is rare, but possible. Do not do a sinus rinse if you have had:  Ear or nasal surgery.  An ear infection.  Blocked ears. Supplies needed:  Saline solution or powder.  Distilled or germ-free (sterile) water may be needed to mix with saline powder. ? You may use boiled and cooled tap water. Boil tap water for 5 minutes; cool until it is lukewarm. Use within 24 hours. ? Do not use regular tap water  to mix with the saline solution.  Neti pot or nasal rinse bottle. This releases the saline solution into your nose and through your sinuses. You can buy neti pots and rinse bottles: ? At your local pharmacy. ? At a health food store. ? Online. How to perform a sinus rinse  1. Wash your hands with soap and water. 2. Wash your device using the directions that came with it. 3. Dry your device. 4. Use the solution that comes with your device or one that is sold separately in stores. Follow the mixing directions on the package if you need to mix with sterile or distilled water. 5. Fill your device with the amount of saline solution stated in the device  instructions. 6. Stand over a sink and tilt your head sideways over the sink. 7. Place the spout of the device in your upper nostril (the one closer to the ceiling). 8. Gently pour or squeeze the saline solution into your nasal cavity. The liquid should drain to your lower nostril if you are not too stuffed up (congested). 9. While rinsing, breathe through your open mouth. 10. Gently blow your nose to clear any mucus and rinse solution. Blowing too hard may cause ear pain. 11. Repeat in your other nostril. 12. Clean and rinse your device with clean water. 13. Air-dry your device. Talk with your doctor or pharmacist if you have questions about how to do a sinus rinse. Summary  A sinus rinse is a home treatment. It rinses your sinuses with a mixture of salt and water (saline solution).  A sinus rinse is normally very safe and helpful. Follow all instructions carefully.  Talk with your doctor about whether a sinus rinse might help you. This information is not intended to replace advice given to you by your health care provider. Make sure you discuss any questions you have with your health care provider. Document Released: 01/26/2014 Document Revised: 04/28/2017 Document Reviewed: 04/28/2017 Elsevier Interactive Patient Education  2019 [employer].  [employer] Vaporizer A cool mist vaporizer is a device that releases a cool mist into the air. If you have a cough or a cold, using a vaporizer may help relieve your symptoms. The mist adds moisture to the air, which may help thin your mucus and make it less sticky. When your mucus is thin and less sticky, it easier for you to breathe and to cough up secretions. Do not use a vaporizer if you are allergic to mold. Follow these instructions at home:  Follow the instructions that come with the vaporizer.  Do not use anything other than distilled water in the vaporizer.  Do not run the vaporizer all of the time. Doing that can cause mold or  bacteria to grow in the vaporizer.  Clean the vaporizer after each time that you use it.  Clean and dry the vaporizer well before storing it.  Stop using the vaporizer if your breathing symptoms get worse. This information is not intended to replace advice given to you by your health care provider. Make sure you discuss any questions you have with your health care provider. Document Released: 03/28/2004 Document Revised: 01/19/2016 Document Reviewed: 09/30/2015 Elsevier Interactive Patient Education  2019 Elsevier Inc.   Cough, Adult  Coughing is a reflex that clears your throat and your airways. Coughing helps to heal and protect your lungs. It is normal to cough occasionally, but a cough that happens with other symptoms or lasts a long time may be a sign of  a condition that needs treatment. A cough may last only 2-3 weeks (acute), or it may last longer than 8 weeks (chronic). What are the causes? Coughing is commonly caused by:  Breathing in substances that irritate your lungs.  A viral or bacterial respiratory infection.  Allergies.  Asthma.  Postnasal drip.  Smoking.  Acid backing up from the stomach into the esophagus (gastroesophageal reflux).  Certain medicines.  Chronic lung problems, including COPD (or rarely, lung cancer).  Other medical conditions such as heart failure. Follow these instructions at home: Pay attention to any changes in your symptoms. Take these actions to help with your discomfort:  Take medicines only as told by your health care provider. ? If you were prescribed an antibiotic medicine, take it as told by your health care provider. Do not stop taking the antibiotic even if you start to feel better. ? Talk with your health care provider before you take a cough suppressant medicine.  Drink enough fluid to keep your urine clear or pale yellow.  If the air is dry, use a cold steam vaporizer or humidifier in your bedroom or your home to help  loosen secretions.  Avoid anything that causes you to cough at work or at home.  If your cough is worse at night, try sleeping in a semi-upright position.  Avoid cigarette smoke. If you smoke, quit smoking. If you need help quitting, ask your health care provider.  Avoid caffeine.  Avoid alcohol.  Rest as needed. Contact a health care provider if:  You have new symptoms.  You cough up pus.  Your cough does not get better after 2-3 weeks, or your cough gets worse.  You cannot control your cough with suppressant medicines and you are losing sleep.  You develop pain that is getting worse or pain that is not controlled with pain medicines.  You have a fever.  You have unexplained weight loss.  You have night sweats. Get help right away if:  You cough up blood.  You have difficulty breathing.  Your heartbeat is very fast. This information is not intended to replace advice given to you by your health care provider. Make sure you discuss any questions you have with your health care provider. Document Released: 12/28/2010 Document Revised: 12/07/2015 Document Reviewed: 09/07/2014 Elsevier Interactive Patient Education  2019 [employer].

## 2018-08-13 NOTE — MAU Provider Note (Signed)
Chief Complaint:  Nasal Congestion   First Provider Initiated Contact with Patient 08/13/18 1810     HPI: Kristin Graves is a 23 y.o. G1P0 at [redacted]w[redacted]d who presents to maternity admissions reporting was told by [facility] to go to MAU to be seen. [facility] of flu, flu was neg, pt had cough, running nose and feeling fatigues for 2 days now, has not taken anything to make it better, pt denies coughing blood, back or bone pain, no fever currently, no sick contact or recent travels. [facility] recommended pt get seen due to fast heart rate of 110s, pt endorses not having heart palpitation just feeling her heart beat fast at time, but this is not the first time, no cardiology consult on file.  Denies contractions, leakage of fluid or vaginal bleeding. Good fetal movement.   Pregnancy Course:   Past Medical History:  Diagnosis Date  . Anxiety   . Asthma    yrs. since she had to use rescue inhaler , it was exercise induced   . IBS (irritable bowel syndrome)    OB History  Gravida Para Term Preterm AB Living  1            SAB TAB Ectopic Multiple Live Births               # Outcome Date GA Lbr Len/2nd Weight Sex Delivery Anes PTL Lv  1 Current            Past Surgical History:  Procedure Laterality Date  . 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 THYROIDECTOMY Right 02/22/2016   Procedure: Right THYROIDECTOMY;  Surgeon: Suzanna Obey, MD;  [facility];  Service: ENT;  Laterality: Right;  . WISDOM TOOTH EXTRACTION     History reviewed. No pertinent family history. Social History   Tobacco Use  . Smoking status: Never Smoker  . Smokeless tobacco: Never Used  Substance Use Topics  . Alcohol use: No  . Drug use: No   Allergies  Allergen Reactions  . No Known Allergies Other (See Comments)   Medications Prior to Admission  Medication Sig Dispense Refill Last Dose  . escitalopram (LEXAPRO) 10 MG tablet Take 10 mg by mouth daily.   08/13/2018 at  Unknown time  . Multiple Vitamins-Minerals (MULTIVITAMIN PO) Take 1 tablet by mouth daily.   08/12/2018 at Unknown time  . OVER THE COUNTER MEDICATION Take 1 tablet by mouth daily. OTC Allertec   08/13/2018 at Unknown time  . Ascorbic Acid (VITAMIN C PO) Take 1 tablet by mouth daily.   02/21/2016 at Unknown time  . BLISOVI 24 FE 1-20 MG-MCG(24) tablet Take 1 tablet by mouth daily.   02/21/2016 at Unknown time  . Cholecalciferol (VITAMIN D PO) Take 1 tablet by mouth daily.    02/21/2016 at Unknown time  . HYDROcodone-acetaminophen (NORCO/VICODIN) 5-325 MG tablet Take 1-2 tablets by mouth every 4 (four) hours as needed for moderate pain. 30 tablet 0   . LORazepam (ATIVAN) 0.5 MG tablet Take 0.5 mg by mouth daily as needed for anxiety.    02/21/2016 at Unknown time  . Probiotic Product (PROBIOTIC PO) Take 1 capsule by mouth daily.   02/21/2016 at Unknown time    I have reviewed patient's Past Medical Hx, Surgical Hx, Family Hx, Social Hx, medications and allergies.   ROS:  Review of Systems  Constitutional: Positive for fatigue.  HENT: Positive for congestion, postnasal drip, rhinorrhea,  sinus pressure and sore throat.   Eyes: Negative.   Respiratory: Positive for cough.   Cardiovascular: Negative.   Gastrointestinal: Negative.   Endocrine: Negative.   Genitourinary: Negative.   Musculoskeletal: Negative.   Skin: Negative.   Allergic/Immunologic: Negative.   Neurological: Negative.   Hematological: Negative.   Psychiatric/Behavioral: Negative.     Physical Exam   Patient Vitals for the past 24 hrs:  BP Temp Pulse Resp SpO2 Weight  08/13/18 1748 119/74 98.1 F (36.7 C) (!) 111 20 94 % -  08/13/18 1741 - - - - - 79.2 kg   Constitutional: Well-developed, well-nourished female in no acute distress.  Cardiovascular: normal rate, RRR Respiratory: normal effort, CTA Bilat GI: Abd soft, non-tender, gravid appropriate for gestational age. Pos BS x 4 MS: Extremities nontender, no edema, normal ROM,  NO CVAT  Neurologic: Alert and oriented x 4.  GU: Neg CVAT.  Pelvic: Deferred   NST: FHR baseline 140s bpm, Variability: moderate, Accelerations:present, Decelerations:  Absent= Cat 1/Reactive UC:   none  Labs: No results found for this or any previous visit (from the past 24 hour(s)).  Imaging:  No results found.  MAU Course: Orders Placed This Encounter  Procedures  . Urinalysis, Routine w reflex microscopic  . Diet - low sodium heart healthy  . Increase activity slowly  . Call MD for:  . Call MD for:  temperature >100.4  . Call MD for:  persistant nausea and vomiting  . Call MD for:  severe uncontrolled pain  . Call MD for:  redness, tenderness, or signs of infection (pain, swelling, redness, odor or green/yellow discharge around incision site)  . Call MD for:  difficulty breathing, headache or visual disturbances  . Call MD for:  hives  . Call MD for:  persistant dizziness or light-headedness  . Call MD for:  extreme fatigue  . (HEART FAILURE PATIENTS) Call MD:  Anytime you have any of the following symptoms: 1) 3 pound weight gain in 24 hours or 5 pounds in 1 week 2) shortness of breath, with or without a dry hacking cough 3) swelling in the hands, feet or stomach 4) if you have to sleep on extra pillows at night in order to breathe.  . Discharge patient Discharge disposition: 01-Home or Self Care; Discharge patient date: 08/13/2018   No orders of the defined types were placed in this encounter.   MDM: Reveiwed chart, labs, VS, Pe, nst REACTIVE, PT STABLE, DISCHARGE HOME WITH NASOPHARYNGITIS.   Assessment: 1. Nasopharyngitis   2. Tachycardia   Kristin Graves is a 23 y.o. G1P0 at 2068w0d stable with common cold, flu neg [facility], nst reactive, lungs normal and tachycardia resolved during her stay, HR was in 90 with elevation to 110s. Pt stable and discharge home.   Plan: Discharge home in stable condition.  Labor precautions and fetal kick counts Nasopharyngitis:  May take OTC mucinex, or Nyquil, increase water intake and rest/ Please report to the MAU if CP or sob should occur or fever does not declined below 103 despite taking otc tylenol/ Your common cold s/sx should resolve in 2 weeks.  Follow-up Information    [facility].   Specialty:  Obstetrics and Gynecology Why[facility]  [facility].  Contact information: [address] [place] [id number] [phone]          Allergies as of 08/13/2018      Reactions   No  Known Allergies Other (See Comments)      Medication List    STOP taking these medications   HYDROcodone-acetaminophen 5-325 MG tablet Commonly known as:  NORCO/VICODIN     TAKE these medications   BLISOVI 24 FE 1-20 MG-MCG(24) tablet Generic drug:  Norethindrone Acetate-Ethinyl Estrad-FE Take 1 tablet by mouth daily.   escitalopram 10 MG tablet Commonly known as:  LEXAPRO Take 10 mg by mouth daily.   LORazepam 0.5 MG tablet Commonly known as:  ATIVAN Take 0.5 mg by mouth daily as needed for anxiety.   MULTIVITAMIN PO Take 1 tablet by mouth daily.   OVER THE COUNTER MEDICATION Take 1 tablet by mouth daily. OTC Allertec   PROBIOTIC PO Take 1 capsule by mouth daily.   VITAMIN C PO Take 1 tablet by mouth daily.   VITAMIN D PO Take 1 tablet by mouth daily.       [facility] NP-C, CNM [place], [place], [place] 08/13/2018 6:29 PM

## 2018-09-04 LAB — OB RESULTS CONSOLE GBS: GBS: POSITIVE

## 2018-09-20 ENCOUNTER — Encounter (HOSPITAL_COMMUNITY): Payer: Self-pay | Admitting: *Deleted

## 2018-09-20 ENCOUNTER — Other Ambulatory Visit: Payer: Self-pay

## 2018-09-20 ENCOUNTER — Inpatient Hospital Stay (EMERGENCY_DEPARTMENT_HOSPITAL)
Admission: AD | Admit: 2018-09-20 | Discharge: 2018-09-20 | Disposition: A | Discharge status: Home / Self Care | Payer: 59 | Source: Home / Self Care | Attending: Obstetrics and Gynecology | Admitting: Obstetrics and Gynecology

## 2018-09-20 DIAGNOSIS — R030 Elevated blood-pressure reading, without diagnosis of hypertension: Secondary | ICD-10-CM | POA: Insufficient documentation

## 2018-09-20 DIAGNOSIS — O134 Gestational [pregnancy-induced] hypertension without significant proteinuria, complicating childbirth: Secondary | ICD-10-CM | POA: Diagnosis not present

## 2018-09-20 DIAGNOSIS — Z3A39 39 weeks gestation of pregnancy: Secondary | ICD-10-CM

## 2018-09-20 DIAGNOSIS — O479 False labor, unspecified: Secondary | ICD-10-CM | POA: Insufficient documentation

## 2018-09-20 LAB — COMPREHENSIVE METABOLIC PANEL
ALT: 14 U/L (ref 0–44)
AST: 21 U/L (ref 15–41)
Albumin: 3.2 g/dL — ABNORMAL LOW (ref 3.5–5.0)
Alkaline Phosphatase: 116 U/L (ref 38–126)
Anion gap: 12 (ref 5–15)
BUN: <5 mg/dL — ABNORMAL LOW (ref 6–20)
CALCIUM: 9.4 mg/dL (ref 8.9–10.3)
CHLORIDE: 104 mmol/L (ref 98–111)
CO2: 20 mmol/L — ABNORMAL LOW (ref 22–32)
CREATININE: 0.62 mg/dL (ref 0.44–1.00)
Glucose, Bld: 78 mg/dL (ref 70–99)
Potassium: 3.4 mmol/L — ABNORMAL LOW (ref 3.5–5.1)
Sodium: 136 mmol/L (ref 135–145)
TOTAL PROTEIN: 6.6 g/dL (ref 6.5–8.1)
Total Bilirubin: 0.7 mg/dL (ref 0.3–1.2)

## 2018-09-20 LAB — URINALYSIS, ROUTINE W REFLEX MICROSCOPIC
Bilirubin Urine: NEGATIVE
GLUCOSE, UA: NEGATIVE mg/dL
Ketones, ur: NEGATIVE mg/dL
Leukocytes,Ua: NEGATIVE
NITRITE: NEGATIVE
PH: 8 (ref 5.0–8.0)
Protein, ur: NEGATIVE mg/dL
SPECIFIC GRAVITY, URINE: 1.001 — AB (ref 1.005–1.030)

## 2018-09-20 LAB — CBC WITH DIFFERENTIAL/PLATELET
Abs Immature Granulocytes: 0.2 10*3/uL — ABNORMAL HIGH (ref 0.00–0.07)
BAND NEUTROPHILS: 2 %
BASOS ABS: 0 10*3/uL (ref 0.0–0.1)
Basophils Relative: 0 %
EOS ABS: 0 10*3/uL (ref 0.0–0.5)
Eosinophils Relative: 0 %
HCT: 33.6 % — ABNORMAL LOW (ref 36.0–46.0)
Hemoglobin: 11.3 g/dL — ABNORMAL LOW (ref 12.0–15.0)
LYMPHS ABS: 2.4 10*3/uL (ref 0.7–4.0)
LYMPHS PCT: 11 %
MCH: 29.1 pg (ref 26.0–34.0)
MCHC: 33.6 g/dL (ref 30.0–36.0)
MCV: 86.6 fL (ref 80.0–100.0)
MONOS PCT: 4 %
Metamyelocytes Relative: 1 %
Monocytes Absolute: 0.9 10*3/uL (ref 0.1–1.0)
NEUTROS ABS: 18.6 10*3/uL — AB (ref 1.7–7.7)
Neutrophils Relative %: 82 %
PLATELETS: 303 10*3/uL (ref 150–400)
RBC: 3.88 MIL/uL (ref 3.87–5.11)
RDW: 13.8 % (ref 11.5–15.5)
WBC: 22.2 10*3/uL — ABNORMAL HIGH (ref 4.0–10.5)
nRBC: 0 % (ref 0.0–0.2)
nRBC: 0 /100 WBC

## 2018-09-20 LAB — PROTEIN / CREATININE RATIO, URINE: CREATININE, URINE: 21.91 mg/dL

## 2018-09-20 NOTE — MAU Provider Note (Signed)
First Provider Initiated Contact with Patient 09/20/18 1530   S: [name] is a 23 y.o. G1P0 at [redacted]w[redacted]d  who presents to MAU today complaining contractions q two minutes since 0800 this morning. She denies vaginal bleeding. She denies LOF. She reports normal fetal movement.    Patient denies history of elevated blood pressure. She states she has been sick with flu-like symptoms for the past three weeks but has been feeling better for the past two days. She denies urinary symptoms, fever, flank pain,   O: BP 127/78   Pulse 84   Temp 98.1 F (36.7 C)   Resp 20   LMP 12/18/2017   SpO2 100%  GENERAL: Well-developed, well-nourished female in no acute distress.  HEAD: Normocephalic, atraumatic.  CHEST: Normal effort of breathing, regular heart rate ABDOMEN: Soft, nontender, gravid  Cervical exam: Unchanged from office Dilation: 1 Effacement (%): 60 Cervical Position: Posterior Station: -3 Presentation: Vertex Exam by:: Lourdes Sledge, RN  Fetal Monitoring: Baseline: 140 Variability: moderate Accelerations: positive 15 x 15 Decelerations: none Contractions: irregular, mild to moderate, q 1-6 minutes  MDM  --New onset elevated blood pressures. No severe range, denies severe symptoms --Elevated white cell count. Patient reports recent severe cold, possible virus with symptoms lasting 2-3 weeks. Now improving significantly --[place], FNP notified of patient's arrival and new onset elevated BPs at 1540  Patient Vitals for the past 24 hrs:  BP Temp Pulse Resp SpO2  09/20/18 1700 127/78 - 84 - -  09/20/18 1646 119/75 - 87 - -  09/20/18 1630 128/83 - 77 - -  09/20/18 1616 138/74 - 77 - -  09/20/18 1609 (!) 141/89 - 83 - -  09/20/18 1547 (!) 140/94 - 81 - -  09/20/18 1534 (!) 144/87 - 77 - -  09/20/18 1533 (!) 144/87 98.1 F (36.7 C) - - -  09/20/18 1532 (!) 151/88 - 83 - -  09/20/18 1531 (!) 151/88 - 89 20 100 %    Results for orders placed or performed during the  hospital encounter of 09/20/18 (from the past 24 hour(s))  CBC with Differential/Platelet     Status: Abnormal   Collection Time: 09/20/18  4:06 PM  Result Value Ref Range   WBC 22.2 (H) 4.0 - 10.5 K/uL   RBC 3.88 3.87 - 5.11 MIL/uL   Hemoglobin 11.3 (L) 12.0 - 15.0 g/dL   HCT 45.0 (L) 38.8 - 82.8 %   MCV 86.6 80.0 - 100.0 fL   MCH 29.1 26.0 - 34.0 pg   MCHC 33.6 30.0 - 36.0 g/dL   RDW 00.3 49.1 - 79.1 %   Platelets 303 150 - 400 K/uL   nRBC 0.0 0.0 - 0.2 %   Neutrophils Relative % 82 %   Neutro Abs 18.6 (H) 1.7 - 7.7 K/uL   Band Neutrophils 2 %   Lymphocytes Relative 11 %   Lymphs Abs 2.4 0.7 - 4.0 K/uL   Monocytes Relative 4 %   Monocytes Absolute 0.9 0.1 - 1.0 K/uL   Eosinophils Relative 0 %   Eosinophils Absolute 0.0 0.0 - 0.5 K/uL   Basophils Relative 0 %   Basophils Absolute 0.0 0.0 - 0.1 K/uL   WBC Morphology See Note    nRBC 0 0 /100 WBC   Metamyelocytes Relative 1 %   Abs Immature Granulocytes 0.20 (H) 0.00 - 0.07 K/uL   Polychromasia PRESENT   Comprehensive metabolic panel     Status: Abnormal   Collection Time: 09/20/18  4:06 PM  Result Value Ref Range   Sodium 136 135 - 145 mmol/L   Potassium 3.4 (L) 3.5 - 5.1 mmol/L   Chloride 104 98 - 111 mmol/L   CO2 20 (L) 22 - 32 mmol/L   Glucose, Bld 78 70 - 99 mg/dL   BUN <5 (L) 6 - 20 mg/dL   Creatinine, Ser 7.01 0.44 - 1.00 mg/dL   Calcium 9.4 8.9 - 41.0 mg/dL   Total Protein 6.6 6.5 - 8.1 g/dL   Albumin 3.2 (L) 3.5 - 5.0 g/dL   AST 21 15 - 41 U/L   ALT 14 0 - 44 U/L   Alkaline Phosphatase 116 38 - 126 U/L   Total Bilirubin 0.7 0.3 - 1.2 mg/dL   GFR calc non Af Amer >60 >60 mL/min   GFR calc Af Amer >60 >60 mL/min   Anion gap 12 5 - 15  Protein / creatinine ratio, urine     Status: None   Collection Time: 09/20/18  4:08 PM  Result Value Ref Range   Creatinine, Urine 21.91 mg/dL   Total Protein, Urine <6.0 mg/dL   Protein Creatinine Ratio        0.00 - 0.15 mg/mg[Cre]  Urinalysis, Routine w reflex  microscopic     Status: Abnormal   Collection Time: 09/20/18  5:21 PM  Result Value Ref Range   Color, Urine STRAW (A) YELLOW   APPearance CLEAR CLEAR   Specific Gravity, Urine 1.001 (L) 1.005 - 1.030   pH 8.0 5.0 - 8.0   Glucose, UA NEGATIVE NEGATIVE mg/dL   Hgb urine dipstick SMALL (A) NEGATIVE   Bilirubin Urine NEGATIVE NEGATIVE   Ketones, ur NEGATIVE NEGATIVE mg/dL   Protein, ur NEGATIVE NEGATIVE mg/dL   Nitrite NEGATIVE NEGATIVE   Leukocytes,Ua NEGATIVE NEGATIVE   RBC / HPF 0-5 0 - 5 RBC/hpf   Bacteria, UA RARE (A) NONE SEEN   Squamous Epithelial / LPF 0-5 0 - 5     A: SIUP at [redacted]w[redacted]d  False labor  New onset elevated BP, normal PEC labs Reactive tracing  P: Discharge home in stable condition  F/U: Patient's next OB appointment is Tuesday 09/22/2018  Calvert Cantor, CNM 09/20/2018 5:42 PM

## 2018-09-20 NOTE — MAU Note (Signed)
Infrequent contraction started at home around 0800, + FM, no bleeding or leaking

## 2018-09-20 NOTE — Discharge Instructions (Signed)
Braxton Hicks Contractions Contractions of the uterus can occur throughout pregnancy, but they are not always a sign that you are in labor. You may have practice contractions called Braxton Hicks contractions. These false labor contractions are sometimes confused with true labor. What are Braxton Hicks contractions? Braxton Hicks contractions are tightening movements that occur in the muscles of the uterus before labor. Unlike true labor contractions, these contractions do not result in opening (dilation) and thinning of the cervix. Toward the end of pregnancy (32-34 weeks), Braxton Hicks contractions can happen more often and may become stronger. These contractions are sometimes difficult to tell apart from true labor because they can be very uncomfortable. You should not feel embarrassed if you go to the hospital with false labor. Sometimes, the only way to tell if you are in true labor is for your health care provider to look for changes in the cervix. The health care provider will do a physical exam and may monitor your contractions. If you are not in true labor, the exam should show that your cervix is not dilating and your water has not broken. If there are no other health problems associated with your pregnancy, it is completely safe for you to be sent home with false labor. You may continue to have Braxton Hicks contractions until you go into true labor. How to tell the difference between true labor and false labor True labor  Contractions last 30-70 seconds.  Contractions become very regular.  Discomfort is usually felt in the top of the uterus, and it spreads to the lower abdomen and low back.  Contractions do not go away with walking.  Contractions usually become more intense and increase in frequency.  The cervix dilates and gets thinner. False labor  Contractions are usually shorter and not as strong as true labor contractions.  Contractions are usually irregular.  Contractions  are often felt in the front of the lower abdomen and in the groin.  Contractions may go away when you walk around or change positions while lying down.  Contractions get weaker and are shorter-lasting as time goes on.  The cervix usually does not dilate or become thin. Follow these instructions at home:   Take over-the-counter and prescription medicines only as told by your health care provider.  Keep up with your usual exercises and follow other instructions from your health care provider.  Eat and drink lightly if you think you are going into labor.  If Braxton Hicks contractions are making you uncomfortable: ? Change your position from lying down or resting to walking, or change from walking to resting. ? Sit and rest in a tub of warm water. ? Drink enough fluid to keep your urine pale yellow. Dehydration may cause these contractions. ? Do slow and deep breathing several times an hour.  Keep all follow-up prenatal visits as told by your health care provider. This is important. Contact a health care provider if:  You have a fever.  You have continuous pain in your abdomen. Get help right away if:  Your contractions become stronger, more regular, and closer together.  You have fluid leaking or gushing from your vagina.  You pass blood-tinged mucus (bloody show).  You have bleeding from your vagina.  You have low back pain that you never had before.  You feel your baby's head pushing down and causing pelvic pressure.  Your baby is not moving inside you as much as it used to. Summary  Contractions that occur before labor are   called Braxton Hicks contractions, false labor, or practice contractions.  Braxton Hicks contractions are usually shorter, weaker, farther apart, and less regular than true labor contractions. True labor contractions usually become progressively stronger and regular, and they become more frequent.  Manage discomfort from Braxton Hicks contractions  by changing position, resting in a warm bath, drinking plenty of water, or practicing deep breathing. This information is not intended to replace advice given to you by your health care provider. Make sure you discuss any questions you have with your health care provider. Document Released: 11/14/2016 Document Revised: 04/15/2017 Document Reviewed: 11/14/2016 Elsevier Interactive Patient Education  2019 [employer]  

## 2018-09-21 ENCOUNTER — Inpatient Hospital Stay (HOSPITAL_COMMUNITY): Payer: 59 | Admitting: Anesthesiology

## 2018-09-21 ENCOUNTER — Inpatient Hospital Stay (HOSPITAL_COMMUNITY)
Admission: AD | Admit: 2018-09-21 | Discharge: 2018-09-23 | DRG: 807 | Disposition: A | Discharge status: Home / Self Care | Payer: 59 | Attending: Obstetrics and Gynecology | Admitting: Obstetrics and Gynecology

## 2018-09-21 ENCOUNTER — Encounter (HOSPITAL_COMMUNITY): Payer: Self-pay | Admitting: *Deleted

## 2018-09-21 DIAGNOSIS — F419 Anxiety disorder, unspecified: Secondary | ICD-10-CM | POA: Diagnosis present

## 2018-09-21 DIAGNOSIS — O99344 Other mental disorders complicating childbirth: Secondary | ICD-10-CM | POA: Diagnosis present

## 2018-09-21 DIAGNOSIS — J45909 Unspecified asthma, uncomplicated: Secondary | ICD-10-CM | POA: Diagnosis present

## 2018-09-21 DIAGNOSIS — O9952 Diseases of the respiratory system complicating childbirth: Secondary | ICD-10-CM | POA: Diagnosis present

## 2018-09-21 DIAGNOSIS — Z3A39 39 weeks gestation of pregnancy: Secondary | ICD-10-CM

## 2018-09-21 DIAGNOSIS — O139 Gestational [pregnancy-induced] hypertension without significant proteinuria, unspecified trimester: Secondary | ICD-10-CM | POA: Diagnosis present

## 2018-09-21 DIAGNOSIS — O134 Gestational [pregnancy-induced] hypertension without significant proteinuria, complicating childbirth: Principal | ICD-10-CM | POA: Diagnosis present

## 2018-09-21 LAB — HEPATITIS B SURFACE ANTIGEN: Hepatitis B Surface Ag: NEGATIVE

## 2018-09-21 LAB — TYPE AND SCREEN
ABO/RH(D): A POS
Antibody Screen: NEGATIVE

## 2018-09-21 LAB — CBC
HCT: 34.4 % — ABNORMAL LOW (ref 36.0–46.0)
Hemoglobin: 11.3 g/dL — ABNORMAL LOW (ref 12.0–15.0)
MCH: 28.7 pg (ref 26.0–34.0)
MCHC: 32.8 g/dL (ref 30.0–36.0)
MCV: 87.3 fL (ref 80.0–100.0)
Platelets: 318 10*3/uL (ref 150–400)
RBC: 3.94 MIL/uL (ref 3.87–5.11)
RDW: 13.9 % (ref 11.5–15.5)
WBC: 24.8 10*3/uL — ABNORMAL HIGH (ref 4.0–10.5)
nRBC: 0 % (ref 0.0–0.2)

## 2018-09-21 LAB — ABO/RH: ABO/RH(D): A POS

## 2018-09-21 LAB — TSH: TSH: 8.691 u[IU]/mL — ABNORMAL HIGH (ref 0.350–4.500)

## 2018-09-21 LAB — RPR: RPR: NONREACTIVE

## 2018-09-21 LAB — T4, FREE: Free T4: 0.57 ng/dL — ABNORMAL LOW (ref 0.82–1.77)

## 2018-09-21 MED ORDER — EPHEDRINE 5 MG/ML INJ
10.0000 mg | INTRAVENOUS | Status: DC | PRN
  Filled 2018-09-21: qty 2

## 2018-09-21 MED ORDER — LEVOTHYROXINE SODIUM 100 MCG PO TABS
100.0000 ug | ORAL_TABLET | Freq: Every day | ORAL | Status: DC
  Administered 2018-09-22 – 2018-09-23 (×2): 100 ug via ORAL
  Filled 2018-09-21 (×2): qty 1

## 2018-09-21 MED ORDER — SOD CITRATE-CITRIC ACID 500-334 MG/5ML PO SOLN
30.0000 mL | ORAL | Status: DC | PRN
  Filled 2018-09-21: qty 15

## 2018-09-21 MED ORDER — LIDOCAINE HCL (PF) 1 % IJ SOLN: 30.0000 mL | INTRAMUSCULAR | Status: DC | PRN

## 2018-09-21 MED ORDER — PHENYLEPHRINE 40 MCG/ML (10ML) SYRINGE FOR IV PUSH (FOR BLOOD PRESSURE SUPPORT)
80.0000 ug | PREFILLED_SYRINGE | INTRAVENOUS | Status: DC | PRN
  Filled 2018-09-21: qty 10

## 2018-09-21 MED ORDER — LIDOCAINE HCL (PF) 1 % IJ SOLN
INTRAMUSCULAR | Status: DC | PRN
  Administered 2018-09-21: 6 mL via EPIDURAL

## 2018-09-21 MED ORDER — LACTATED RINGERS IV SOLN
INTRAVENOUS | Status: DC
  Administered 2018-09-21 (×2): via INTRAVENOUS

## 2018-09-21 MED ORDER — OXYCODONE-ACETAMINOPHEN 5-325 MG PO TABS: 2.0000 | ORAL_TABLET | ORAL | Status: DC | PRN

## 2018-09-21 MED ORDER — DIBUCAINE 1 % RE OINT: 1.0000 "application " | TOPICAL_OINTMENT | RECTAL | Status: DC | PRN

## 2018-09-21 MED ORDER — OXYTOCIN 40 UNITS IN NORMAL SALINE INFUSION - SIMPLE MED
2.5000 [IU]/h | INTRAVENOUS | Status: DC
  Filled 2018-09-21: qty 1000

## 2018-09-21 MED ORDER — DIPHENHYDRAMINE HCL 25 MG PO CAPS: 25.0000 mg | ORAL_CAPSULE | Freq: Four times a day (QID) | ORAL | Status: DC | PRN

## 2018-09-21 MED ORDER — IBUPROFEN 600 MG PO TABS
600.0000 mg | ORAL_TABLET | Freq: Four times a day (QID) | ORAL | Status: DC
  Administered 2018-09-21 – 2018-09-23 (×8): 600 mg via ORAL
  Filled 2018-09-21 (×8): qty 1

## 2018-09-21 MED ORDER — ONDANSETRON HCL 4 MG PO TABS: 4.0000 mg | ORAL_TABLET | ORAL | Status: DC | PRN

## 2018-09-21 MED ORDER — ZOLPIDEM TARTRATE 5 MG PO TABS: 5.0000 mg | ORAL_TABLET | Freq: Every evening | ORAL | Status: DC | PRN

## 2018-09-21 MED ORDER — ACETAMINOPHEN 325 MG PO TABS: 650.0000 mg | ORAL_TABLET | ORAL | Status: DC | PRN

## 2018-09-21 MED ORDER — PHENYLEPHRINE 40 MCG/ML (10ML) SYRINGE FOR IV PUSH (FOR BLOOD PRESSURE SUPPORT)
80.0000 ug | PREFILLED_SYRINGE | INTRAVENOUS | Status: DC | PRN
  Filled 2018-09-21 (×2): qty 10

## 2018-09-21 MED ORDER — ONDANSETRON HCL 4 MG/2ML IJ SOLN: 4.0000 mg | INTRAMUSCULAR | Status: DC | PRN

## 2018-09-21 MED ORDER — SODIUM CHLORIDE (PF) 0.9 % IJ SOLN
INTRAMUSCULAR | Status: DC | PRN
  Administered 2018-09-21: 12 mL/h via EPIDURAL

## 2018-09-21 MED ORDER — DIPHENHYDRAMINE HCL 50 MG/ML IJ SOLN: 12.5000 mg | INTRAMUSCULAR | Status: DC | PRN

## 2018-09-21 MED ORDER — FENTANYL-BUPIVACAINE-NACL 0.5-0.125-0.9 MG/250ML-% EP SOLN
12.0000 mL/h | EPIDURAL | Status: DC | PRN
  Filled 2018-09-21: qty 250

## 2018-09-21 MED ORDER — SENNOSIDES-DOCUSATE SODIUM 8.6-50 MG PO TABS
2.0000 | ORAL_TABLET | ORAL | Status: DC
  Administered 2018-09-22 (×2): 2 via ORAL
  Filled 2018-09-21 (×2): qty 2

## 2018-09-21 MED ORDER — BENZOCAINE-MENTHOL 20-0.5 % EX AERO
1.0000 "application " | INHALATION_SPRAY | CUTANEOUS | Status: DC | PRN
  Administered 2018-09-21: 1 via TOPICAL
  Filled 2018-09-21: qty 56

## 2018-09-21 MED ORDER — WITCH HAZEL-GLYCERIN EX PADS: 1.0000 "application " | MEDICATED_PAD | CUTANEOUS | Status: DC | PRN

## 2018-09-21 MED ORDER — FENTANYL-BUPIVACAINE-NACL 0.5-0.125-0.9 MG/250ML-% EP SOLN: 12.0000 mL/h | EPIDURAL | Status: DC | PRN

## 2018-09-21 MED ORDER — TETANUS-DIPHTH-ACELL PERTUSSIS 5-2.5-18.5 LF-MCG/0.5 IM SUSP: 0.5000 mL | Freq: Once | INTRAMUSCULAR | Status: DC

## 2018-09-21 MED ORDER — COCONUT OIL OIL: 1.0000 "application " | TOPICAL_OIL | Status: DC | PRN

## 2018-09-21 MED ORDER — LACTATED RINGERS IV SOLN: 500.0000 mL | INTRAVENOUS | Status: DC | PRN

## 2018-09-21 MED ORDER — LACTATED RINGERS IV SOLN
500.0000 mL | Freq: Once | INTRAVENOUS | Status: AC
  Administered 2018-09-21: 500 mL via INTRAVENOUS

## 2018-09-21 MED ORDER — OXYTOCIN BOLUS FROM INFUSION
500.0000 mL | Freq: Once | INTRAVENOUS | Status: AC
  Administered 2018-09-21: 500 mL via INTRAVENOUS

## 2018-09-21 MED ORDER — ONDANSETRON HCL 4 MG/2ML IJ SOLN: 4.0000 mg | Freq: Four times a day (QID) | INTRAMUSCULAR | Status: DC | PRN

## 2018-09-21 MED ORDER — ESCITALOPRAM OXALATE 20 MG PO TABS
20.0000 mg | ORAL_TABLET | Freq: Every day | ORAL | Status: DC
  Administered 2018-09-22 – 2018-09-23 (×2): 20 mg via ORAL
  Filled 2018-09-21 (×2): qty 1

## 2018-09-21 MED ORDER — SIMETHICONE 80 MG PO CHEW: 80.0000 mg | CHEWABLE_TABLET | ORAL | Status: DC | PRN

## 2018-09-21 MED ORDER — PRENATAL MULTIVITAMIN CH
1.0000 | ORAL_TABLET | Freq: Every day | ORAL | Status: DC
  Administered 2018-09-22 – 2018-09-23 (×2): 1 via ORAL
  Filled 2018-09-21 (×2): qty 1

## 2018-09-21 MED ORDER — OXYCODONE-ACETAMINOPHEN 5-325 MG PO TABS: 1.0000 | ORAL_TABLET | ORAL | Status: DC | PRN

## 2018-09-21 NOTE — Lactation Note (Signed)
This note was copied from a baby's chart. Lactation Consultation Note  Patient Name: Kristin Graves Today's Date: 09/21/2018   Attempted to see mom two times this evening.  930 pm and 957 pm. Dad  Holding baby, mom sleeping. Hx anxiety, did not wake up.  Baby Kristin now 8 hours old.  Maternal Data    Feeding Feeding Type: Breast Fed  [facility] Score                   Interventions    Lactation Tools Discussed/Used     Consult Status      Leon Goodnow Michaelle Copas 09/21/2018, 9:57 PM

## 2018-09-21 NOTE — Progress Notes (Signed)
Labor Progress Note Sybol Vebberis a 23 y.o.female, G1P0000, IUP at39.4weeks, presenting for early labor and new onset GHTN with neg PreE labs Pt was complete and +2 station and we began pushing at 1210pm with a category 1 tracing. Pt is pushing well. Left RN to push with pt after 20 minutes.  Subjective:  Pt has excellent pain control with epidural. She is cheerful and an effective pusher. Supportive family at [facility].  Objective:  BP 118/79   Pulse 86   Temp 98.5 F (36.9 C) (Oral)   Resp 18   Ht 5\' 11"  (1.803 m)   Wt 85.3 kg   LMP 12/18/2017   SpO2 95%   BMI 26.22 kg/m     FHT: Category 2, now some mild variables with pushing. Still accels present and moderate variability. Reassuring fetal tracing. UC:   regular, every 2-4 minutes SVE:   Dilation: 10 Effacement (%): 100 Station: Plus 1, Plus 2 Exam by:: Lima, rn Pitocin at n/a MVUs n/a  Assessment/Plan:  Fetal Wellbeing: reassuring traing Labor: complete and pushing <1 hour Preeclampsia:  GHTN diagnosis, normotensive since epidural at 0330, 9h ago GBS:   neg Pain Control:  neg Anticipated MOD:  SVD    Jonetta Speak, CNM 09/21/2018, 12:47 PM

## 2018-09-21 NOTE — Anesthesia Procedure Notes (Signed)
Epidural Patient location during procedure: OB Start time: 09/21/2018 3:28 AM End time: 09/21/2018 3:32 AM  Staffing Anesthesiologist: Bethena Midget, MD  Preanesthetic Checklist Completed: patient identified, site marked, surgical consent, pre-op evaluation, timeout performed, IV checked, risks and benefits discussed and monitors and equipment checked  Epidural Patient position: sitting Prep: site prepped and draped and DuraPrep Patient monitoring: continuous pulse ox and blood pressure Approach: midline Location: L3-L4 Injection technique: LOR air  Needle:  Needle type: Tuohy  Needle gauge: 17 G Needle length: 9 cm and 9 Needle insertion depth: 4 cm Catheter type: closed end flexible Catheter size: 19 Gauge Catheter at skin depth: 9 cm Test dose: negative  Assessment Events: blood not aspirated, injection not painful, no injection resistance, negative IV test and no paresthesia

## 2018-09-21 NOTE — Progress Notes (Signed)
Labor Progress Note Jezlyn Vebberis a 23 y.o.female, G1P0000, IUP at39.4weeks, presenting for early labor and new onset GHTN with neg PreE labs. Called to pt room at shift change for prolonged decel. Pt had made good cervical change and FHT recovered to baseline with resuscitation measures.  Subjective:  Patient was calm and cooperative, pain completely covered by her epidural. Repositioned to her side with peanut ball and resting comfortably. Checked on her again at 10am and still comfortable, resting in a side lying position.  Objective:  BP 126/85   Pulse 90   Temp 98.5 F (36.9 C) (Oral)   Resp 16   Ht 5\' 11"  (1.803 m)   Wt 85.3 kg   LMP 12/18/2017   SpO2 95%   BMI 26.22 kg/m     FHT: baseline 150, NICHD category 1, moderate variability, accels present, decels absent.  UC:   regular, every 2-4 minutes, lasting 90-120 sec SVE:   Dilation: Lip/rim Effacement (%): 100 Station: Plus 1 Exam by:: Lima, rn Pitocin at n/a MVUs n/a  Assessment/Plan:  Fetal Wellbeing: cat 1 Labor: progressing very well Preeclampsia:  GHTN but normotensive now x 7 hours GBS:   neg Pain Control:  epidural Anticipated MOD:  SVD    Jonetta Speak, CNM 09/21/2018, 10:12 AM

## 2018-09-21 NOTE — Anesthesia Preprocedure Evaluation (Signed)
Anesthesia Evaluation  Patient identified by MRN, date of birth, ID band Patient awake    Reviewed: Allergy & Precautions, H&P , NPO status , Patient's Chart, lab work & pertinent test results, reviewed documented beta blocker date and time   Airway Mallampati: I  TM Distance: >3 FB Neck ROM: full    Dental no notable dental hx. (+) Teeth Intact   Pulmonary asthma ,    Pulmonary exam normal breath sounds clear to auscultation       Cardiovascular hypertension, Pt. on medications Normal cardiovascular exam Rhythm:regular Rate:Normal     Neuro/Psych PSYCHIATRIC DISORDERS Anxiety negative neurological ROS     GI/Hepatic negative GI ROS, Neg liver ROS,   Endo/Other  negative endocrine ROS  Renal/GU negative Renal ROS  negative genitourinary   Musculoskeletal   Abdominal   Peds  Hematology negative hematology ROS (+)   Anesthesia Other Findings   Reproductive/Obstetrics (+) Pregnancy                             Anesthesia Physical Anesthesia Plan  ASA: II  Anesthesia Plan: Epidural   Post-op Pain Management:    Induction:   PONV Risk Score and Plan:   Airway Management Planned:   Additional Equipment:   Intra-op Plan:   Post-operative Plan:   Informed Consent: I have reviewed the patients History and Physical, chart, labs and discussed the procedure including the risks, benefits and alternatives for the proposed anesthesia with the patient or authorized representative who has indicated his/her understanding and acceptance.     Dental Advisory Given  Plan Discussed with:   Anesthesia Plan Comments: (Labs checked- platelets confirmed with RN in room. Fetal heart tracing, per RN, reported to be stable enough for sitting procedure. Discussed epidural, and patient consents to the procedure:  included risk of possible headache,backache, failed block, allergic reaction, and nerve  injury. This patient was asked if she had any questions or concerns before the procedure started.)        Anesthesia Quick Evaluation

## 2018-09-21 NOTE — Progress Notes (Signed)
Pt prefers to wait til am for Lexapo and Synthroid.

## 2018-09-21 NOTE — MAU Note (Signed)
Pt here with c/o contractions every 2 min. Denies any leaking or bleeding. BP was "high today but came down" and cervix was 1 cm.

## 2018-09-21 NOTE — Anesthesia Pain Management Evaluation Note (Signed)
  [facility] Pain Management Visit Note  Patient: Kristin Graves, 23 y.o., female  "Hello I am a member of the anesthesia team at [facility]. We have an anesthesia team available at all times to provide care throughout the hospital, including epidural management and anesthesia for C-section. I don't know your plan for the delivery whether it a natural birth, water birth, IV sedation, nitrous supplementation, doula or epidural, but we want to meet your pain goals."   1.Was your pain managed to your expectations on prior hospitalizations?   No prior hospitalizations  2.What is your expectation for pain management during this hospitalization?     Epidural  3.How can we help you reach that goal? Follow patient until delivery to ensure good analgesia with epidural.  Record the patient's initial score and the patient's pain goal.   Pain: 3  Pain Goal: 3 [facility] wants you to be able to say your pain was always managed very well.  Damari Suastegui 09/21/2018

## 2018-09-21 NOTE — Progress Notes (Signed)
Labor Progress Note  Per H&P: Kristin Graves is a 23 y.o. female, G1P0000, IUP at 39.4 weeks, presenting for early labor and new onset GHTN with neg PreE labs, PCR undectable. Pt endorse + Fm. Denies vaginal leakage. Denies vaginal bleeding. Endorses feeling intense cxt's. Denies HA, RUQ pain and vision changes. H/O anxiety on lexapro, thyroidectomy (Partial--check TSH and Free T4 q trimester, WNL at NOB, last TSH December was 1.07, T4 free 1.6, t3uptake 18% ). EFW=2516G, 65TH%. Baby Female. GBS-.  Subjective: Pt stable in bed, not coping with cxt well, pt requesting epidural placement now. Mother and partner at bedside Patient Active Problem List   Diagnosis Date Noted  . Gestational hypertension 09/21/2018  . Thyroid mass 02/22/2016   Objective: BP 119/75   Pulse 69   Temp 97.7 F (36.5 C) (Axillary)   Resp 20   Ht 5\' 11"  (1.803 m)   Wt 85.3 kg   LMP 12/18/2017   SpO2 100%   BMI 26.22 kg/m  No intake/output data recorded. No intake/output data recorded. NST: FHR baseline 135 bpm, Variability: moderate, Accelerations:present, Decelerations:  Absent= Cat 1/Reactive CTX:  regular, every 2-3 minutes, lasting 20-60 seconds, maternal perception is strong  Uterus gravid, soft non tender, moderate to palpate with contractions.  SVE:  Dilation: 3 Effacement (%): 80 Station: -1 Exam by:: Lesly Rubenstein M  Assessment:  Kristin Graves is a 23 y.o. female, G1P0000, IUP at 39.4 weeks, presenting for early labor and new onset GHTN with neg PreE labs, PCR undectable. Pt endorse + Fm. Denies vaginal leakage. Denies vaginal bleeding. Endorses feeling intense cxt's. Denies HA, RUQ pain and vision changes. H/O anxiety on lexapro, thyroidectomy (Partial--check TSH and Free T4 q trimester, WNL at NOB, last TSH December was 1.07, T4 free 1.6, t3uptake 18% ). EFW=2516G, 65TH%. Baby Female. GBS-.Nauturally progress, getting epidural placed now, stable, BP normotensive no HA, RUQ pain nor vision changes.  Patient  Active Problem List   Diagnosis Date Noted  . Gestational hypertension 09/21/2018  . Thyroid mass 02/22/2016   NICHD: Category 1  Membranes:  Intact, no s/s of infection  Pain management:               Epidural placement:  Being placed now  GBS Negative  Plan: Continue labor plan Continuous/intermittent monitoring Rest Ambulate Frequent position changes to facilitate fetal rotation and descent. Will reassess with cervical exam at 8 or earlier if necessary Thyroidectomy: TSH pending.  GHTN: continue to monitor BP.  Continue pitocin per protocol Anticipate labor progression and vaginal delivery.    Dale Kirtland, NP-C, CNM, MSN 09/21/2018. 3:47 AM

## 2018-09-21 NOTE — H&P (Signed)
Kristin Graves is a 23 y.o. female, G1P0000, IUP at 39.4 weeks, presenting for early labor and new onset GHTN with neg PreE labs, PCR undectable. Pt endorse + Fm. Denies vaginal leakage. Denies vaginal bleeding. Endorses feeling intense cxt's. Denies HA, RUQ pain and vision changes. H/O anxiety on lexapro, thyroidectomy (Partial--check TSH and Free T4 q trimester, WNL at NOB, last TSH December was 1.07, T4 free 1.6, t3uptake 18% ). EFW=2516G, 65TH%. Baby Female. GBS-.  Patient Active Problem List   Diagnosis Date Noted  . Thyroid mass 02/22/2016   Medications Prior to Admission  Medication Sig Dispense Refill Last Dose  . Ascorbic Acid (VITAMIN C PO) Take 1 tablet by mouth daily.   02/21/2016 at Unknown time  . BLISOVI 24 FE 1-20 MG-MCG(24) tablet Take 1 tablet by mouth daily.   02/21/2016 at Unknown time  . Cholecalciferol (VITAMIN D PO) Take 1 tablet by mouth daily.    02/21/2016 at Unknown time  . escitalopram (LEXAPRO) 10 MG tablet Take 10 mg by mouth daily.   08/13/2018 at Unknown time  . LORazepam (ATIVAN) 0.5 MG tablet Take 0.5 mg by mouth daily as needed for anxiety.    02/21/2016 at Unknown time  . Multiple Vitamins-Minerals (MULTIVITAMIN PO) Take 1 tablet by mouth daily.   08/12/2018 at Unknown time  . OVER THE COUNTER MEDICATION Take 1 tablet by mouth daily. OTC Allertec   08/13/2018 at Unknown time  . Probiotic Product (PROBIOTIC PO) Take 1 capsule by mouth daily.   02/21/2016 at Unknown time    Past Medical History:  Diagnosis Date  . Anxiety   . Asthma    yrs. since she had to use rescue inhaler , it was exercise induced   . IBS (irritable bowel syndrome)      No current facility-administered medications on file prior to encounter.    Current Outpatient Medications on File Prior to Encounter  Medication Sig Dispense Refill  . Ascorbic Acid (VITAMIN C PO) Take 1 tablet by mouth daily.    Marland Kitchen BLISOVI 24 FE 1-20 MG-MCG(24) tablet Take 1 tablet by mouth daily.    . Cholecalciferol (VITAMIN  D PO) Take 1 tablet by mouth daily.     Marland Kitchen escitalopram (LEXAPRO) 10 MG tablet Take 10 mg by mouth daily.    Marland Kitchen LORazepam (ATIVAN) 0.5 MG tablet Take 0.5 mg by mouth daily as needed for anxiety.     . Multiple Vitamins-Minerals (MULTIVITAMIN PO) Take 1 tablet by mouth daily.    Marland Kitchen OVER THE COUNTER MEDICATION Take 1 tablet by mouth daily. OTC Allertec    . Probiotic Product (PROBIOTIC PO) Take 1 capsule by mouth daily.       Allergies  Allergen Reactions  . No Known Allergies Other (See Comments)    History of present pregnancy: Pt Info/Preference:  Screening/Consents:  Labs:   EDD: Estimated Date of Delivery: 09/24/18  Establised: Patient's last menstrual period was 12/18/2017.  Anatomy Scan: Date: 10/21 Placenta Location: anterior Genetic Screen: Panoroma:low risk female AFP: neg First Tri: Quad:  Office: [facility]             First PNV: 12 wg Blood Type    Language: english Last PNV: 38.5 wg Rhogam    Flu Vaccine:  utd   Antibody    TDaP vaccine utd   GTT: Early: 4.7 hga1c Third Trimester: 133  Feeding Plan: breast BTL: no Rubella:    Contraception: ??? VBAC: No RPR:     Circumcision: female  HBsAg:    Pediatrician:  ???   HIV:     Prenatal Classes: yes Additional [place]: 1/27: SINGLE[facility] PREGNANCY. VERTEX PRESENTATION. ANTERIOR PLACENTA. AFI IS NORMAL=19CM, 70TH%. EFW=2516G, 65TH% (BPD AND HC SUPOPTIMAL DUE TO F.HEAD POSITION) FHR=144BPM. CERVIX APPEARS CLOSED AND MEASURES 3.8CM TRANSABDOMINALY. BILATERAL ADNEXAS APPEAR UNREMARKABLE. GBS:  (For PCN allergy, check sensitivities)       Chlamydia: neg    MFM Referral/Consult:  GC: neg  Support Person: partner   PAP: 2018- normal  Pain Management: epidural Neonatologist Referral:  Hgb Electrophoresis:  N/A  Birth Plan: none   Hgb NOB: ???    28W: 10.2   OB History    Gravida  1   Para      Term      Preterm      AB      Living        SAB      TAB      Ectopic      Multiple      Live Births             Past Medical  History:  Diagnosis Date  . Anxiety   . Asthma    yrs. since she had to use rescue inhaler , it was exercise induced   . IBS (irritable bowel syndrome)    Past Surgical History:  Procedure Laterality Date  . 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 THYROIDECTOMY Right 02/22/2016   Procedure: Right THYROIDECTOMY;  Surgeon: Suzanna Obey, MD;  Location: [facility] OR;  Service: ENT;  Laterality: Right;  . WISDOM TOOTH EXTRACTION     Family History: family history is not on file. Social History:  reports that she has never smoked. She has never used smokeless tobacco. She reports that she does not drink alcohol or use drugs.   Prenatal Transfer Tool  Maternal Diabetes: No Genetic Screening: Normal Maternal Ultrasounds/Referrals: Normal Fetal Ultrasounds or other Referrals:  None Maternal Substance Abuse:  No Significant Maternal Medications:  Meds include: Other: Lexapro Significant Maternal Lab Results: Lab values include: Group B Strep negative  ROS:  Review of Systems  Constitutional: Negative.   HENT: Negative.   Eyes: Negative.   Respiratory: Negative.   Cardiovascular: Negative.   Gastrointestinal: Positive for abdominal pain.  Genitourinary: Negative.   Musculoskeletal: Negative.   Skin: Negative.   Neurological: Negative.   Endo/Heme/Allergies: Negative.   Psychiatric/Behavioral: Negative.      Physical Exam: BP (!) 147/98   Pulse 77   Temp 97.7 F (36.5 C) (Axillary)   Resp 20   Ht  (1.803 m)   Wt 85.3 kg   LMP 12/18/2017   SpO2 100%   BMI 26.22 kg/m   Physical Exam  Constitutional: She is oriented to person, place, and time and well-developed, well-nourished, and in no distress.  HENT:  Head: Normocephalic and atraumatic.  Eyes: Pupils are equal, round, and reactive to light. Conjunctivae are normal.  Neck: Normal range of motion. Neck supple.  Cardiovascular: Normal rate and regular rhythm.  Pulmonary/Chest: Effort normal and breath  sounds normal.  Abdominal: Soft. Bowel sounds are normal.  Genitourinary:    Genitourinary Comments: Uterus soft non-tender, gravida equal to dates, pelvis adequate for vaginal delivery    Musculoskeletal: Normal range of motion.  Neurological: She is alert and oriented to person, place, and time. Gait normal.  Skin: Skin is warm and dry.  Psychiatric: Affect normal.  Anxious, pain  Nursing note and vitals reviewed.    NST: FHR baseline 135 bpm, Variability: moderate, Accelerations:present, Decelerations:  Absent= Cat 1/Reactive UC:   irregular, every 3 minutes, then spaces out to occ, lasting 40-100 seconds SVE:   Dilation: 2 Effacement (%): 90 Station: -1 Exam by:: ansha-mensah, rnc , vertex verified by fetal sutures.  Leopold's: Position vertex, EFW 7.5 via leopold's.   Labs: Results for orders placed or performed during the hospital encounter of 09/20/18 (from the past 24 hour(s))  CBC with Differential/Platelet     Status: Abnormal   Collection Time: 09/20/18  4:06 PM  Result Value Ref Range   WBC 22.2 (H) 4.0 - 10.5 K/uL   RBC 3.88 3.87 - 5.11 MIL/uL   Hemoglobin 11.3 (L) 12.0 - 15.0 g/dL   HCT 01.5 (L) 86.8 - 25.7 %   MCV 86.6 80.0 - 100.0 fL   MCH 29.1 26.0 - 34.0 pg   MCHC 33.6 30.0 - 36.0 g/dL   RDW 49.3 55.2 - 17.4 %   Platelets 303 150 - 400 K/uL   nRBC 0.0 0.0 - 0.2 %   Neutrophils Relative % 82 %   Neutro Abs 18.6 (H) 1.7 - 7.7 K/uL   Band Neutrophils 2 %   Lymphocytes Relative 11 %   Lymphs Abs 2.4 0.7 - 4.0 K/uL   Monocytes Relative 4 %   Monocytes Absolute 0.9 0.1 - 1.0 K/uL   Eosinophils Relative 0 %   Eosinophils Absolute 0.0 0.0 - 0.5 K/uL   Basophils Relative 0 %   Basophils Absolute 0.0 0.0 - 0.1 K/uL   WBC Morphology See Note    nRBC 0 0 /100 WBC   Metamyelocytes Relative 1 %   Abs Immature Granulocytes 0.20 (H) 0.00 - 0.07 K/uL   Polychromasia PRESENT   Comprehensive metabolic panel     Status: Abnormal   Collection Time: 09/20/18  4:06 PM   Result Value Ref Range   Sodium 136 135 - 145 mmol/L   Potassium 3.4 (L) 3.5 - 5.1 mmol/L   Chloride 104 98 - 111 mmol/L   CO2 20 (L) 22 - 32 mmol/L   Glucose, Bld 78 70 - 99 mg/dL   BUN <5 (L) 6 - 20 mg/dL   Creatinine, Ser 7.15 0.44 - 1.00 mg/dL   Calcium 9.4 8.9 - 95.3 mg/dL   Total Protein 6.6 6.5 - 8.1 g/dL   Albumin 3.2 (L) 3.5 - 5.0 g/dL   AST 21 15 - 41 U/L   ALT 14 0 - 44 U/L   Alkaline Phosphatase 116 38 - 126 U/L   Total Bilirubin 0.7 0.3 - 1.2 mg/dL   GFR calc non Af Amer >60 >60 mL/min   GFR calc Af Amer >60 >60 mL/min   Anion gap 12 5 - 15  Protein / creatinine ratio, urine     Status: None   Collection Time: 09/20/18  4:08 PM  Result Value Ref Range   Creatinine, Urine 21.91 mg/dL   Total Protein, Urine <6.0 mg/dL   Protein Creatinine Ratio        0.00 - 0.15 mg/mg[Cre]  Urinalysis, Routine w reflex microscopic     Status: Abnormal   Collection Time: 09/20/18  5:21 PM  Result Value Ref Range   Color, Urine STRAW (A) YELLOW   APPearance CLEAR CLEAR   Specific Gravity, Urine 1.001 (L) 1.005 - 1.030   pH 8.0 5.0 - 8.0   Glucose, UA NEGATIVE NEGATIVE mg/dL   Hgb urine  dipstick SMALL (A) NEGATIVE   Bilirubin Urine NEGATIVE NEGATIVE   Ketones, ur NEGATIVE NEGATIVE mg/dL   Protein, ur NEGATIVE NEGATIVE mg/dL   Nitrite NEGATIVE NEGATIVE   Leukocytes,Ua NEGATIVE NEGATIVE   RBC / HPF 0-5 0 - 5 RBC/hpf   Bacteria, UA RARE (A) NONE SEEN   Squamous Epithelial / LPF 0-5 0 - 5    Imaging:  No results found.  MAU Course: Orders Placed This Encounter  Procedures  . Cervical Exam   No orders of the defined types were placed in this encounter.   Assessment/Plan: Kristin Graves is a 23 y.o. female, G1P0000, IUP at 39.4 weeks, presenting for early labor and new onset GHTN with neg PreE labs, PCR undectable. Pt endorse + Fm. Denies vaginal leakage. Denies vaginal bleeding. Denies feeling cxt's. H/O anxiety on lexapro, thyroidectomy (Partial--check TSH and Free T4 q  trimester, WNL at NOB, last TSH December was 1.07, T4 free 1.6, t3uptake 18% ). EFW=2516G, 65TH%. Baby Female. GBS-. BP currently 147/98, no s/sx. Pt stable.   FWB: Cat 1 Fetal Tracing.   Plan: Admit to [facility] per consult with Dr Dion Body Routine [facility] orders Pain med/epidural prn H/O thyroidectomy: TSH, T3-4 pending. GHTN: Monitor BP. Plan for labetalol if severe range BP and consult for mag to be started if severe features present.  Anxiety: Continue lexapro tomorrow.  Anticipate labor progression   Dale Nephi NP-C, CNM, MSN 09/21/2018, 2:00 AM

## 2018-09-21 NOTE — Anesthesia Postprocedure Evaluation (Signed)
Anesthesia Post Note  Patient: Kristin Graves  Procedure(s) Performed: AN AD HOC LABOR EPIDURAL     Patient location during evaluation: Mother Baby Anesthesia Type: Epidural Level of consciousness: awake and alert Pain management: pain level controlled Vital Signs Assessment: post-procedure vital signs reviewed and stable Respiratory status: spontaneous breathing Cardiovascular status: stable Postop Assessment: no headache, patient able to bend at knees, no backache, no apparent nausea or vomiting, epidural receding, adequate PO intake and able to ambulate Comments: Pt evaluated by Karleen Dolphin Crna    Last Vitals:  Vitals:   09/21/18 1547 09/21/18 1654  BP: 128/88 (!) 135/94  Pulse: 91 88  Resp: 18 18  Temp: 37.4 C 37.3 C  SpO2: 100% 98%    Last Pain:  Vitals:   09/21/18 1547  TempSrc: Oral  PainSc:    Pain Goal:                   Kristin Graves

## 2018-09-22 LAB — CBC
HCT: 32.6 % — ABNORMAL LOW (ref 36.0–46.0)
Hemoglobin: 10.4 g/dL — ABNORMAL LOW (ref 12.0–15.0)
MCH: 28.3 pg (ref 26.0–34.0)
MCHC: 31.9 g/dL (ref 30.0–36.0)
MCV: 88.8 fL (ref 80.0–100.0)
NRBC: 0 % (ref 0.0–0.2)
Platelets: 250 10*3/uL (ref 150–400)
RBC: 3.67 MIL/uL — ABNORMAL LOW (ref 3.87–5.11)
RDW: 14.1 % (ref 11.5–15.5)
WBC: 22 10*3/uL — ABNORMAL HIGH (ref 4.0–10.5)

## 2018-09-22 LAB — T3 UPTAKE: T3 Uptake Ratio: 13 % — ABNORMAL LOW (ref 24–39)

## 2018-09-22 NOTE — Progress Notes (Signed)
[name] was referred for history of anxiety. * Referral screened out by Clinical Social Worker because none of the following criteria appear to apply: ~ History of anxiety/depression during this pregnancy, or of post-partum depression following prior delivery. ~ Diagnosis of anxiety and/or depression within last 3 years OR * [name]'s symptoms currently being treated with medication and/or therapy. Per chart review of PNC records and RN notes, [name] is on Lexapro 20mg daily.  Please contact the Clinical Social Worker if needs arise, by [name] request, or if [name] scores greater than 9/yes to question 10 on Edinburgh Postpartum Depression Screen.      Kristin Graves, MSW, LCSW-A [facility] [place]  [phone]  

## 2018-09-22 NOTE — Lactation Note (Addendum)
This note was copied from a baby's chart. Lactation Consultation Note New mom has asked for formula d/t baby didn't appear to to be satisfied after 10-15 min. Feeding. Mom states baby has been wanting to feed every hour.  LC assessed breast, nipples semi flat. Everts short shaft w/stimulation. Breast tissue very compressible. Baby latched well w/o difficulty. Mom denied pain. Discussed positioning, support, breast massage. Mom was trying to latch baby by using palm of her and by pushing the breast down. Demonstrated "C" hold for latching. Mom latched w/o difficulty.  Encouraged mom to wear bra today. Giving mom shells to assist in everting nipples, hand pump for pre-pumping to define nipples more. Breast tender w/hand expression. Mom states she hand express prior to latching. Mom stated sometimes she can't get anything so she feels there is nothing there and that's why the baby is hungry. Baby had pacifier in mouth when LC entered rm. Discouraged pacifiers. Newborn behavior, STS, I&O, supply and demand discussed. Mom denied any questions at this time.  Encouraged to call for assistance or questions.   Patient Name: Kristin Graves [id number] Date: 09/22/2018 Reason for consult: Initial assessment;1st time breastfeeding   Maternal Data Has patient been taught Hand Expression?: Yes Does the patient have breastfeeding experience prior to this delivery?: No  Feeding Feeding Type: Breast Fed  LATCH Score Latch: Grasps breast easily, tongue down, lips flanged, rhythmical sucking.  Audible Swallowing: A few with stimulation  Type of Nipple: Flat  Comfort (Breast/Nipple): Soft / non-tender  Hold (Positioning): Assistance needed to correctly position infant at breast and maintain latch.  LATCH Score: 7  Interventions Interventions: Breast feeding basics reviewed;Adjust position;Assisted with latch;Support pillows;Skin to skin;Position options;Breast massage;Hand express;Expressed  milk;Pre-pump if needed;Shells;Breast compression;Hand pump  Lactation Tools Discussed/Used Tools: Shells;Pump Shell Type: Inverted Breast pump type: Manual WIC Program: No Pump Review: Setup, frequency, and cleaning;Milk Storage Initiated by:: Peri Jefferson RN IBCLC Date initiated:: 09/22/18   Consult Status Consult Status: Follow-up Date: 09/22/18 Follow-up type: In-patient    [name], Diamond Nickel 09/22/2018, 5:29 AM

## 2018-09-22 NOTE — Lactation Note (Signed)
This note was copied from a baby's chart. Lactation Consultation Note  Patient Name: Kristin Graves [id number] Date: 09/22/2018 Reason for consult: Follow-up assessment;Term;Primapara;1st time breastfeeding  P1 mother whose infant is now 46 hours old.  Mother was finishing a pumping session with the DEBP when I arrived.  She had obtained 1 ml of EBM.  Demonstrated finger feeding to baby with the colostrum drops in flange.  Mother had a curved tip syringe that she is using to help supplement baby's breast feeding and she was able to pull up the 1 ml of EBM and I assisted father to feed this to baby.  Explained cleaning of breast pump parts and syringe.  Father finished cleaning supplies.  Mother had no further questions at this time.  She will continue to watch for feeding cues and feed 8-12 times/24 hours or sooner if baby shows cues.  She will pump for 15 minutes after breast feeding and supplement baby with any EBM she obtains.  Her feeding method of choice for supplementation will be the curved tip syringe.  She will call for latch assistance as needed.  Left Vocera message for RN caring for this patient.   Maternal Data Formula Feeding for Exclusion: No Has patient been taught Hand Expression?: Yes Does the patient have breastfeeding experience prior to this delivery?: No  Feeding Feeding Type: Breast Fed  LATCH Score Latch: Grasps breast easily, tongue down, lips flanged, rhythmical sucking.  Audible Swallowing: A few with stimulation  Type of Nipple: Everted at rest and after stimulation  Comfort (Breast/Nipple): Soft / non-tender  Hold (Positioning): Assistance needed to correctly position infant at breast and maintain latch.  LATCH Score: 8  Interventions    Lactation Tools Discussed/Used Tools: Pump Breast pump type: Double-Electric Breast Pump WIC Program: No Initiated by:: Alreaady initiated   Consult Status Consult Status: Follow-up Date: 09/23/18 Follow-up  type: In-patient    Kristin Graves 09/22/2018, 4:58 PM

## 2018-09-22 NOTE — Progress Notes (Signed)
Subjective: Postpartum Day 1: Vaginal delivery, periurethral laceration Patient up ad lib, reports no syncope or dizziness. Feeding:  breast Contraceptive plan:  Mirena IUD  Objective: Vital signs in last 24 hours: Temp:  [97.8 F (36.6 C)-99.3 F (37.4 C)] 98.3 F (36.8 C) (03/10 1457) Pulse Rate:  [67-91] 71 (03/10 1457) Resp:  [18] 18 (03/10 1457) BP: (127-139)/(81-98) 130/92 (03/10 1457) SpO2:  [98 %-100 %] 99 % (03/09 1900)  Physical Exam:  General: alert, cooperative and no distress Lochia: appropriate Uterine Fundus: firm Perineum: healing well DVT Evaluation: No evidence of DVT seen on physical exam.   CBC Latest Ref Rng & Units 09/22/2018 09/21/2018 09/20/2018  WBC 4.0 - 10.5 K/uL 22.0(H) 24.8(H) 22.2(H)  Hemoglobin 12.0 - 15.0 g/dL 10.4(L) 11.3(L) 11.3(L)  Hematocrit 36.0 - 46.0 % 32.6(L) 34.4(L) 33.6(L)  Platelets 150 - 400 K/uL 250 318 303     Assessment/Plan: Status post vaginal delivery day 1. Stable Continue current care. Plan for discharge tomorrow  Baby Girl, Amaryllis Dyke HartsogCNM 09/22/2018, 3:15 PM

## 2018-09-23 MED ORDER — LEVOTHYROXINE SODIUM 100 MCG PO TABS: 100.0000 ug | ORAL_TABLET | Freq: Every day | ORAL | 0 refills | Status: AC

## 2018-09-23 MED ORDER — IBUPROFEN 600 MG PO TABS: 600.0000 mg | ORAL_TABLET | Freq: Four times a day (QID) | ORAL | 0 refills | Status: AC

## 2018-09-23 NOTE — Discharge Summary (Signed)
SVD OB Discharge Summary     Patient Name: Kristin Graves DOB: April 24, 1996 MRN: [id number]  Date of admission: 09/21/2018 Delivering MD: Jonetta Speak A  Date of delivery: 09/21/2018 Type of delivery: SVD  Newborn Data: Sex: Baby Female Live born female  Birth Weight: 8 lb 2.3 oz (3695 g) APGAR: 9, 9  Newborn Delivery   Birth date/time:  09/21/2018 13:27:00 Delivery type:  Vaginal, Spontaneous     Feeding: breast Infant being discharge to home with mother in stable condition.   Admitting diagnosis: 39WKS CTX Intrauterine pregnancy: [redacted]w[redacted]d     Secondary diagnosis:  Active Problems:   Gestational hypertension   Vaginal delivery   Normal postpartum course                                Complications: None                                                              Intrapartum Procedures: spontaneous vaginal delivery and GBS prophylaxis Postpartum Procedures: none Complications-Operative and Postpartum: periurithral  degree perineal laceration Augmentation: none   History of Present Illness: [name] is a 23 y.o. female, G1P1001, who presents at [redacted]w[redacted]d weeks gestation. The patient has been followed at  [facility]  Her pregnancy has been complicated by:  Patient Active Problem List   Diagnosis Date Noted  . Normal postpartum course 09/23/2018  . Gestational hypertension 09/21/2018  . Vaginal delivery 09/21/2018  . Thyroid mass 02/22/2016   Hospital course:  Onset of Labor With Vaginal Delivery     23 y.o. yo G1P1001 at [redacted]w[redacted]d was admitted in Latent Labor on 09/21/2018. Patient had an uncomplicated labor course as follows:  Membrane Rupture Time/Date: 11:40 AM ,09/21/2018   Intrapartum Procedures: Episiotomy: None [1]                                         Lacerations:  Periurethral [8]  Patient had a delivery of a Viable infant. 09/21/2018  Information for the patient's newborn:  Kristin, Graves [[id number]]  Delivery Method:  Vaginal, Spontaneous(Filed from Delivery Summary)    Pateint had an uncomplicated postpartum course.  She is ambulating, tolerating a regular diet, passing flatus, and urinating well. Patient is discharged home in stable condition on 09/23/18.  Postpartum Day # 2 : S/P NSVD due to 39.4 weeks, presenting for early labor and new onset GHTN with neg PreE labs, PCR undectable. Denies HA, RUQ pain and vision changes. H/O anxiety on lexapro 20mg  daily, pt stable denies HI/SI, happy and bonding appropriatly, thyroidectomy (Partial--check TSH and Free T4 q trimester, WNL at NOB, last TSH December was 1.07, T4 free 1.6, t3uptake 18% ). Labs here resulted hypothyroidism and was started on synthroid, pt to go home on meds and f/u in 6 week PP visit for TSH to be redrawn. Patient up ad lib, denies syncope or dizziness. Reports consuming regular diet without issues and denies N/V. Patient reports 0 bowel movement + passing flatus.  Denies issues with urination and reports bleeding is "light."  Patient is breastfeeding and reports going  well.  Desires IUD for postpartum contraception.  Pain is being appropriately managed with use of po meds. Pt developed GHTN upon admission waa dx, now bp 128/89, denies HA, vision changes or RUQ pain.   Physical exam  Vitals:   09/22/18 0403 09/22/18 1457 09/22/18 2242 09/23/18 0538  BP: (!) 139/98 (!) 130/92 126/87 128/89  Pulse: 70 71 69 68  Resp: 18 18 16 18   Temp: 97.8 F (36.6 C) 98.3 F (36.8 C) 97.8 F (36.6 C) 98.6 F (37 C)  TempSrc: Oral Oral Oral Oral  SpO2:   99%   Weight:      Height:       General: alert, cooperative and no distress Lochia: appropriate Uterine Fundus: firm Perineum: Approximate, no hematomas noted DVT Evaluation: No evidence of DVT seen on physical exam. Negative Homan's sign. No cords or calf tenderness. No significant calf/ankle edema.  Labs: Lab Results  Component Value Date   WBC 22.0 (H) 09/22/2018   HGB 10.4 (L)  09/22/2018   HCT 32.6 (L) 09/22/2018   MCV 88.8 09/22/2018   PLT 250 09/22/2018   CMP Latest Ref Rng & Units 09/20/2018  Glucose 70 - 99 mg/dL 78  BUN 6 - 20 mg/dL <4(U<5(L)  Creatinine 9.810.44 - 1.00 mg/dL 1.910.62  Sodium 478135 - 295145 mmol/L 136  Potassium 3.5 - 5.1 mmol/L 3.4(L)  Chloride 98 - 111 mmol/L 104  CO2 22 - 32 mmol/L 20(L)  Calcium 8.9 - 10.3 mg/dL 9.4  Total Protein 6.5 - 8.1 g/dL 6.6  Total Bilirubin 0.3 - 1.2 mg/dL 0.7  Alkaline Phos 38 - 126 U/L 116  AST 15 - 41 U/L 21  ALT 0 - 44 U/L 14    Date of discharge: 09/23/2018 Discharge Diagnoses: Term Pregnancy-delivered Discharge instruction: per After Visit Summary and "Baby and Me Booklet".  After visit meds:  Allergies as of 09/23/2018      Reactions   No Known Allergies Other (See Comments)      Medication List    TAKE these medications   escitalopram 20 MG tablet Commonly known as:  LEXAPRO Take 20 mg by mouth daily.   ibuprofen 600 MG tablet Commonly known as:  ADVIL,MOTRIN Take 1 tablet (600 mg total) by mouth every 6 (six) hours.   levothyroxine 100 MCG tablet Commonly known as:  SYNTHROID, LEVOTHROID Take 1 tablet (100 mcg total) by mouth daily at 6 (six) AM. Start taking on:  September 24, 2018   OVER THE COUNTER MEDICATION Take 1 tablet by mouth daily. OTC Allertec   prenatal multivitamin Tabs tablet Take 1 tablet by mouth daily at 12 noon.       Activity:           unrestricted and pelvic rest Advance as tolerated. Pelvic rest for 6 weeks.  Diet:                routine Medications: PNV and Ibuprofen Postpartum contraception: IUD Mirena Condition:  Pt discharge to home with baby in stable and Condition Depression: Continue lexapro 20mg  daily, report SI/HI, or PPD s/sx Hypothyroidism: Stay on synthroid PP f/u with labs on 6 weeks PPV.  HGTN: monitor BP at home is >150/90 report may need meds, report HA, vision changes or RUQ pain.   Meds: Allergies as of 09/23/2018      Reactions   No Known  Allergies Other (See Comments)      Medication List    TAKE these medications   escitalopram 20 MG  tablet Commonly known as:  LEXAPRO Take 20 mg by mouth daily.   ibuprofen 600 MG tablet Commonly known as:  ADVIL,MOTRIN Take 1 tablet (600 mg total) by mouth every 6 (six) hours.   levothyroxine 100 MCG tablet Commonly known as:  SYNTHROID, LEVOTHROID Take 1 tablet (100 mcg total) by mouth daily at 6 (six) AM. Start taking on:  September 24, 2018   OVER THE COUNTER MEDICATION Take 1 tablet by mouth daily. OTC Allertec   prenatal multivitamin Tabs tablet Take 1 tablet by mouth daily at 12 noon.       Discharge Follow Up:  Follow-up Information    [facility] Follow up.   Specialty:  Obstetrics and Gynecology Why:  Please call [facility] and make a 1 week f/u for BP check and 6 weeks PPV , thanks.  Contact information: [address] [place] [address] [phone]           Eva, NP-C, CNM 09/23/2018, 11:51 AM  [place], FNP

## 2018-09-23 NOTE — Lactation Note (Signed)
This note was copied from a baby's chart. Lactation Consultation Note  Patient Name: Girl Adam Schepps [id number] Date: 09/23/2018  Mom has no questions or concerns at this time. Mom reports + breast changes w/pregnancy. She says that latch is comfortable & her nipple is rounded when infant releases latch.  Mom is able to recognize the sound of swallows. Mom knows to keep an eye on infant's output. Mom has a Luna Motif pump at home. Our phone number for post-discharge lactation related questions was provided.   Mom is taking escitalopram 20mg  (L2) & levothyroxine (L1).  Lurline Hare [facility] 09/23/2018, 8:14 AM

## 2018-12-29 DIAGNOSIS — F419 Anxiety disorder, unspecified: Secondary | ICD-10-CM | POA: Diagnosis not present

## 2018-12-29 DIAGNOSIS — F439 Reaction to severe stress, unspecified: Secondary | ICD-10-CM | POA: Diagnosis not present

## 2019-10-28 ENCOUNTER — Ambulatory Visit: Payer: 59 | Attending: Internal Medicine

## 2019-10-28 DIAGNOSIS — Z23 Encounter for immunization: Secondary | ICD-10-CM

## 2019-10-28 NOTE — Progress Notes (Signed)
   Covid-19 Vaccination Clinic  Name:  Kristin Graves    MRN: [id number] DOB: 18-Oct-1995  10/28/2019  [name] was observed post Covid-19 immunization for 15 minutes without incident. She was provided with Vaccine Information Sheet and instruction to access the V-Safe system.   [name] was instructed to call 911 with any severe reactions post vaccine: Marland Kitchen Difficulty breathing  . Swelling of face and throat  . A fast heartbeat  . A bad rash all over body  . Dizziness and weakness   Immunizations Administered    Name Date Dose VIS Date Route   Pfizer COVID-19 Vaccine 10/28/2019 12:16 PM 0.3 mL 06/25/2019 Intramuscular   Manufacturer: [employer], [employer]   Lot: [id number]   NDC: [address]-2

## 2019-11-22 ENCOUNTER — Ambulatory Visit: Payer: 59 | Attending: Internal Medicine

## 2019-11-22 DIAGNOSIS — Z23 Encounter for immunization: Secondary | ICD-10-CM

## 2019-11-22 NOTE — Progress Notes (Signed)
   Covid-19 Vaccination Clinic  Name:  Kristin Graves    MRN: [id number] DOB: 11-21-1995  11/22/2019  [name] was observed post Covid-19 immunization for 15 minutes without incident. She was provided with Vaccine Information Sheet and instruction to access the V-Safe system.   [name] was instructed to call 911 with any severe reactions post vaccine: Marland Kitchen Difficulty breathing  . Swelling of face and throat  . A fast heartbeat  . A bad rash all over body  . Dizziness and weakness   Immunizations Administered    Name Date Dose VIS Date Route   Pfizer COVID-19 Vaccine 11/22/2019 12:18 PM 0.3 mL 09/08/2018 Intramuscular   Manufacturer: [employer], [employer]   Lot: [id number]   NDC: [address]-2
